# Patient Record
Sex: Male | Born: 1980
Health system: Southern US, Community
[De-identification: ages and names within clinical notes are randomized; demographics above are authoritative.]

## PROBLEM LIST (undated history)

## (undated) DIAGNOSIS — F909 Attention-deficit hyperactivity disorder, unspecified type: Secondary | ICD-10-CM

## (undated) DIAGNOSIS — F419 Anxiety disorder, unspecified: Secondary | ICD-10-CM

## (undated) DIAGNOSIS — K219 Gastro-esophageal reflux disease without esophagitis: Secondary | ICD-10-CM

## (undated) DIAGNOSIS — K649 Unspecified hemorrhoids: Secondary | ICD-10-CM

## (undated) DIAGNOSIS — Z720 Tobacco use: Secondary | ICD-10-CM

## (undated) HISTORY — DX: Gastro-esophageal reflux disease without esophagitis: K21.9

## (undated) HISTORY — DX: Attention-deficit hyperactivity disorder, unspecified type: F90.9

## (undated) HISTORY — DX: Tobacco use: Z72.0

---

## 1999-10-29 HISTORY — PX: WISDOM TOOTH EXTRACTION: SHX21

## 2006-12-12 ENCOUNTER — Other Ambulatory Visit: Payer: Self-pay

## 2006-12-12 ENCOUNTER — Emergency Department: Payer: Self-pay | Admitting: Unknown Physician Specialty

## 2007-10-09 ENCOUNTER — Emergency Department: Payer: Self-pay | Admitting: Emergency Medicine

## 2007-10-10 ENCOUNTER — Other Ambulatory Visit: Payer: Self-pay

## 2012-10-28 HISTORY — PX: COLONOSCOPY: SHX174

## 2014-03-30 ENCOUNTER — Emergency Department: Payer: Self-pay | Admitting: Emergency Medicine

## 2014-03-30 LAB — CBC
HCT: 41.7 %
HGB: 13.4 g/dL
MCH: 28.8 pg
MCHC: 32.2 g/dL
MCV: 90 fL
Platelet: 206 x10 3/mm 3
RBC: 4.66 x10 6/mm 3
RDW: 13.7 %
WBC: 4.7 x10 3/mm 3

## 2014-03-30 LAB — COMPREHENSIVE METABOLIC PANEL
ALBUMIN: 3.7 g/dL (ref 3.4–5.0)
ANION GAP: 6 — AB (ref 7–16)
Alkaline Phosphatase: 70 U/L
BUN: 21 mg/dL — ABNORMAL HIGH (ref 7–18)
Bilirubin,Total: 0.3 mg/dL (ref 0.2–1.0)
CHLORIDE: 107 mmol/L (ref 98–107)
CREATININE: 0.81 mg/dL (ref 0.60–1.30)
Calcium, Total: 8.4 mg/dL — ABNORMAL LOW (ref 8.5–10.1)
Co2: 27 mmol/L (ref 21–32)
EGFR (African American): >60
Glucose: 87 mg/dL (ref 65–99)
OSMOLALITY: 282 (ref 275–301)
Potassium: 4.7 mmol/L (ref 3.5–5.1)
SGOT(AST): 20 U/L (ref 15–37)
SGPT (ALT): 24 U/L (ref 12–78)
SODIUM: 140 mmol/L (ref 136–145)
Total Protein: 6.8 g/dL (ref 6.4–8.2)

## 2014-03-30 LAB — TROPONIN I: Troponin-I: <0.02 ng/mL

## 2014-12-19 IMAGING — CR DG CHEST 2V
1 series · 2 of 2 positions shown · non-contrast
Comparison: Two-view chest 12/12/2006.

CLINICAL DATA: Chest pain for 3-4 days.

EXAM:
CHEST  2 VIEW

[Series 1: w chest pa · 0.14mm/px · 2 of 2 slices shown]
[im 1/2]
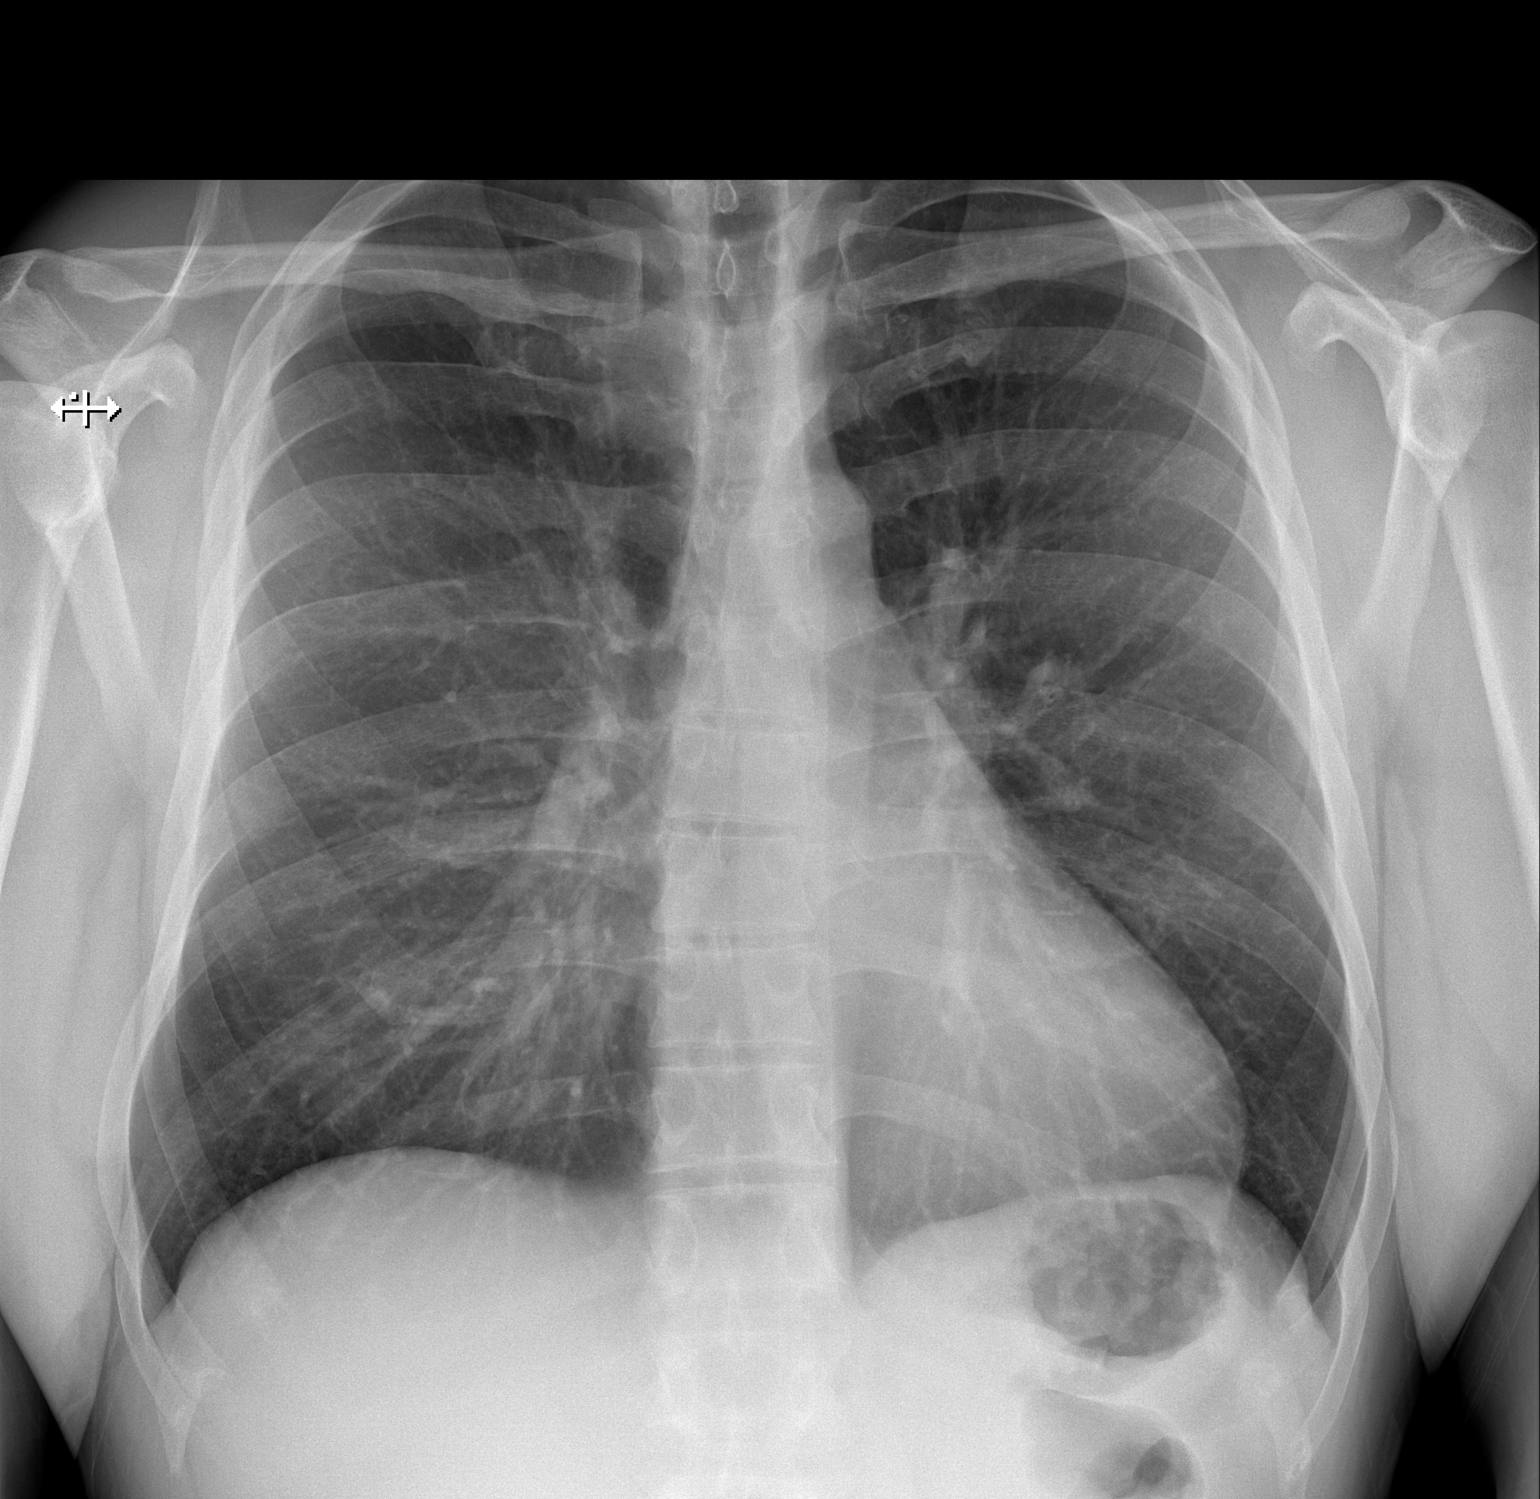
[im 2/2]
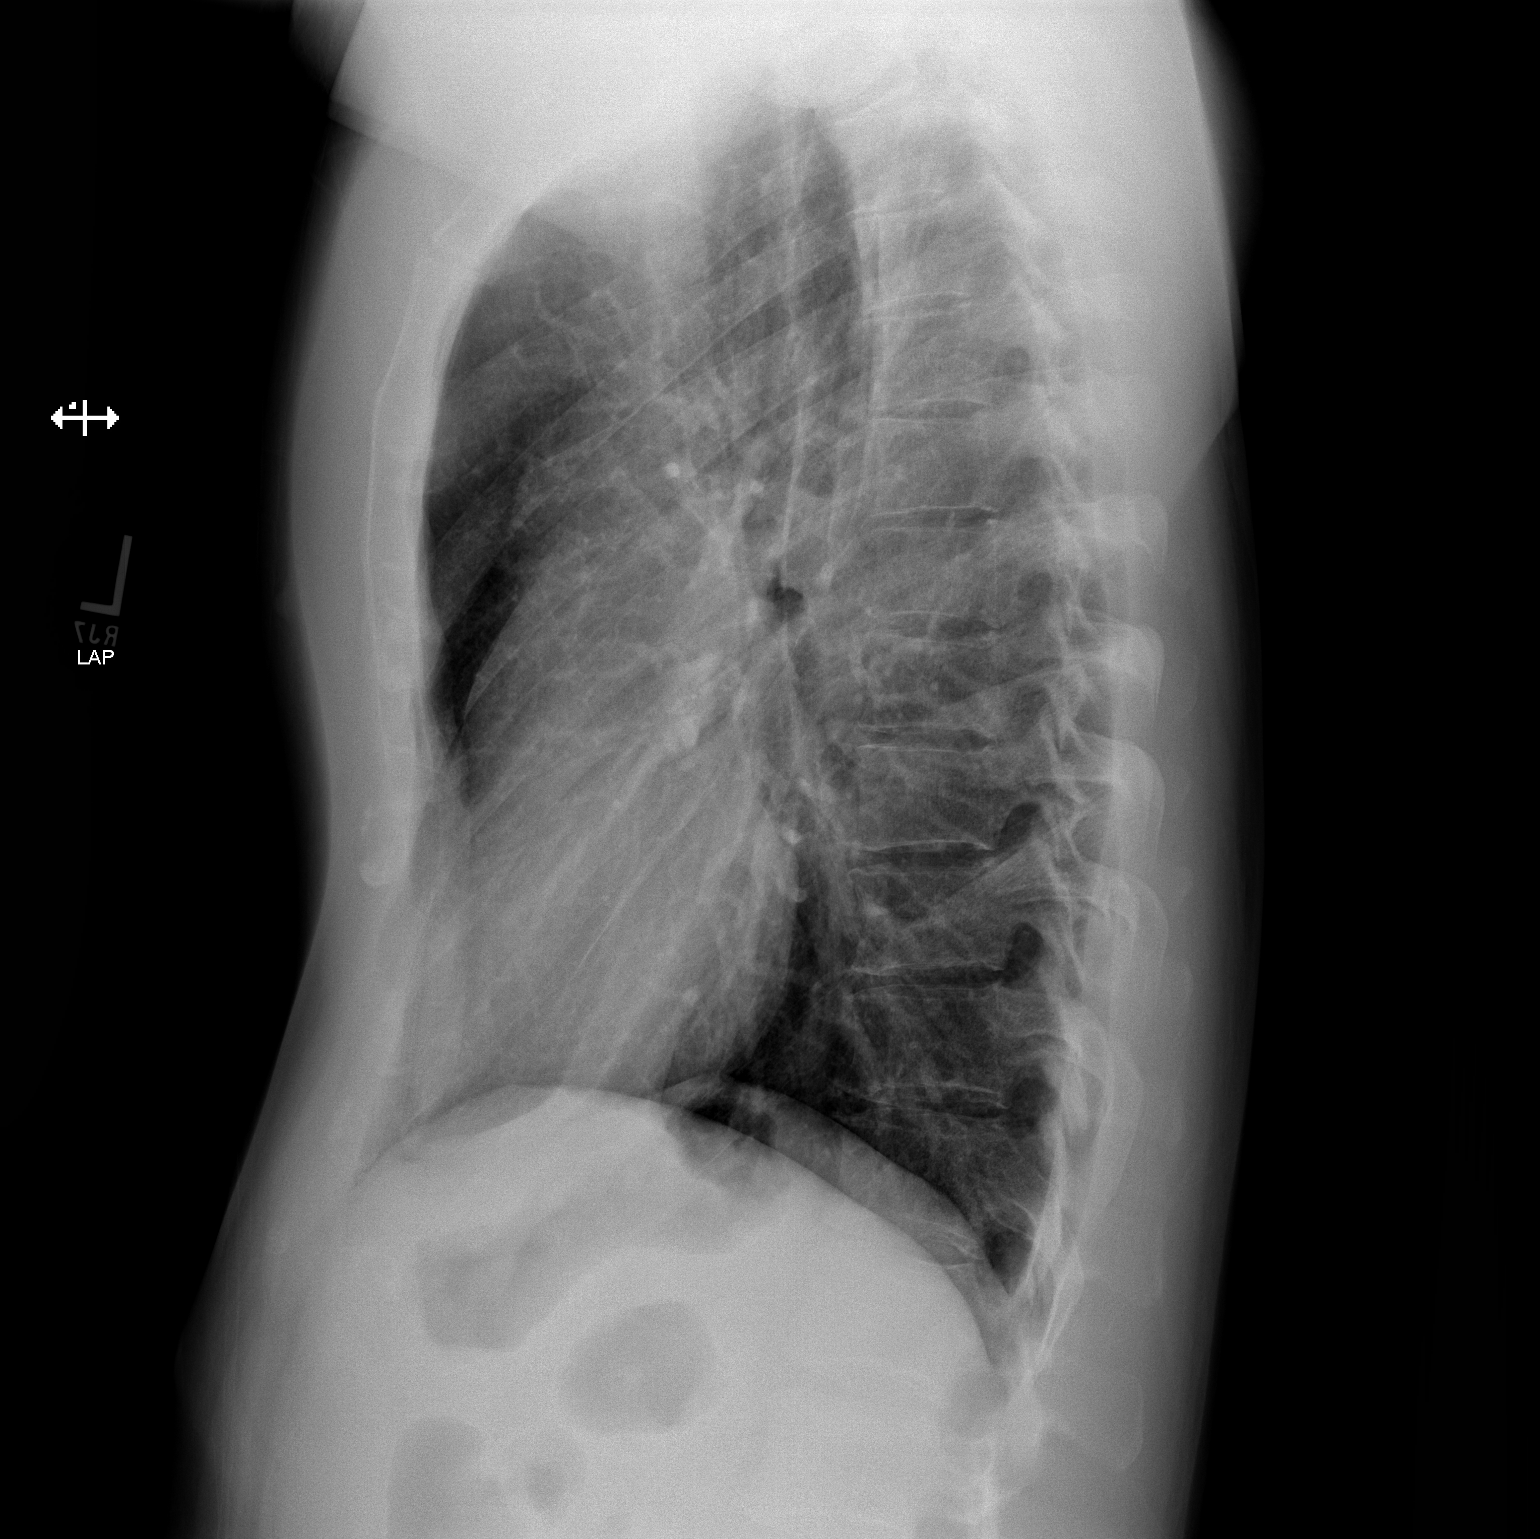

[2 of 2 positions shown; findings below may reference images not displayed]

FINDINGS: The heart size is normal. A pectus excavatum deformity is evident.
The lungs are clear. The visualized soft tissues and bony thorax are
unremarkable.
IMPRESSION: Negative two view chest.

## 2017-04-16 ENCOUNTER — Encounter: Payer: Self-pay | Admitting: Family Medicine

## 2017-04-16 ENCOUNTER — Ambulatory Visit (INDEPENDENT_AMBULATORY_CARE_PROVIDER_SITE_OTHER): Payer: 59 | Admitting: Family Medicine

## 2017-04-16 VITALS — BP 124/80 | Ht 69.0 in | Wt 212.1 lb

## 2017-04-16 DIAGNOSIS — F411 Generalized anxiety disorder: Secondary | ICD-10-CM | POA: Insufficient documentation

## 2017-04-16 DIAGNOSIS — F909 Attention-deficit hyperactivity disorder, unspecified type: Secondary | ICD-10-CM

## 2017-04-16 MED ORDER — AMPHETAMINE-DEXTROAMPHET ER 30 MG PO CP24: 30.0000 mg | ORAL_CAPSULE | Freq: Every day | ORAL | 0 refills | Status: DC

## 2017-04-16 MED ORDER — AMPHETAMINE-DEXTROAMPHET ER 20 MG PO CP24: 20.0000 mg | ORAL_CAPSULE | ORAL | 0 refills | Status: DC

## 2017-04-16 NOTE — Patient Instructions (Addendum)
Web MD look up ADHD

## 2017-04-16 NOTE — Progress Notes (Signed)
   Subjective:    Patient ID: Ronald Mccarty, male    DOB: 1981-03-27, 36 y.o.   MRN: [id number]  HPI  Patient in today with c/o difficulty focusing. Patient states used to take ritalin in grade school.   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   Review of Systems No headache, no major weight loss or weight gain, no chest pain no back pain abdominal pain no change in bowel habits complete ROS otherwise negative     Objective:   Physical Exam  Alert and oriented, vitals reviewed and stable, NAD ENT-TM's and ext canals WNL bilat via otoscopic exam Soft palate, tonsils and post pharynx WNL via oropharyngeal exam Neck-symmetric, no masses; thyroid nonpalpable and nontender Pulmonary-no tachypnea or accessory muscle use; Clear without wheezes via auscultation Card--no abnrml murmurs, rhythm reg and rate WNL Carotid pulses symmetric, without bruits       Assessment & Plan:  adult ADHD long discussion held. Adult ADHD. DSM criteria positive for 9 out of 9 questions and inattention category. Long discussion held regarding medications. Patient deftly would like to try. Adderall XR initiated. 20 mg every morning. Then increase to 30 mg after one month. Follow-up in 4 months. #2 generalized anxiety disorder. Patient took citalopram. Did not like the side  effects and did not help that much. Wishes no further medication in this regard at this time exercise encouraged

## 2017-05-13 ENCOUNTER — Telehealth: Payer: Self-pay | Admitting: Family Medicine

## 2017-05-13 NOTE — Telephone Encounter (Signed)
Patient's Adderral 30 ER was approved from 05/13/17-05/13/18 via [employer].

## 2017-06-11 ENCOUNTER — Telehealth: Payer: Self-pay | Admitting: Family Medicine

## 2017-06-11 MED ORDER — AMPHETAMINE-DEXTROAMPHET ER 30 MG PO CP24: 30.0000 mg | ORAL_CAPSULE | Freq: Every day | ORAL | 0 refills | Status: DC

## 2017-06-11 NOTE — Telephone Encounter (Signed)
If not given enough refill rx til next visit, may go ahead and write two, dr scott shold see around [facility]

## 2017-06-11 NOTE — Telephone Encounter (Signed)
Prescriptions up front for pick up. Patient notified 

## 2017-06-11 NOTE — Telephone Encounter (Signed)
Pt is requesting refills on  amphetamine-dextroamphetamine (ADDERALL XR) 30 MG 24 hr capsule

## 2017-07-11 ENCOUNTER — Telehealth: Payer: Self-pay | Admitting: Family Medicine

## 2017-07-11 ENCOUNTER — Other Ambulatory Visit: Payer: Self-pay | Admitting: *Deleted

## 2017-07-11 MED ORDER — FLUCONAZOLE 150 MG PO TABS: 150.0000 mg | ORAL_TABLET | Freq: Every day | ORAL | 0 refills | Status: DC

## 2017-07-11 NOTE — Telephone Encounter (Signed)
Med sent to pharm. Pt notified.  

## 2017-07-11 NOTE — Telephone Encounter (Signed)
Patients wife believes Norma has a yeast infection.  She, herself, has one.  She is requesting Rx called in.  [facility] [place] (not the one located in [employer]).

## 2017-07-11 NOTE — Telephone Encounter (Signed)
Skin is red and irritated. No discharge

## 2017-07-11 NOTE — Telephone Encounter (Signed)
Diflucan 150 one time dose, topical otc lamisil cr bid to affetecd area

## 2017-08-11 ENCOUNTER — Ambulatory Visit (INDEPENDENT_AMBULATORY_CARE_PROVIDER_SITE_OTHER): Payer: 59 | Admitting: Family Medicine

## 2017-08-11 ENCOUNTER — Encounter: Payer: Self-pay | Admitting: Family Medicine

## 2017-08-11 VITALS — BP 120/80 | Ht 69.0 in | Wt 203.0 lb

## 2017-08-11 DIAGNOSIS — Z23 Encounter for immunization: Secondary | ICD-10-CM | POA: Diagnosis not present

## 2017-08-11 DIAGNOSIS — F909 Attention-deficit hyperactivity disorder, unspecified type: Secondary | ICD-10-CM

## 2017-08-11 MED ORDER — AMPHETAMINE-DEXTROAMPHET ER 30 MG PO CP24: 30.0000 mg | ORAL_CAPSULE | Freq: Every day | ORAL | 0 refills | Status: DC

## 2017-08-11 MED ORDER — AMPHETAMINE-DEXTROAMPHET ER 10 MG PO CP24
10.0000 mg | ORAL_CAPSULE | Freq: Every day | ORAL | 0 refills | Status: DC
Start: 2017-08-11 — End: 2017-08-11

## 2017-08-11 MED ORDER — AMPHETAMINE-DEXTROAMPHET ER 10 MG PO CP24: 10.0000 mg | ORAL_CAPSULE | Freq: Every day | ORAL | 0 refills | Status: DC

## 2017-08-11 NOTE — Progress Notes (Signed)
   Subjective:    Patient ID: Ronald Mccarty, male    DOB: 1981/10/12, 36 y.o.   MRN: [id number]  HPI  Patient was seen today for ADD checkup. -weight, vital signs reviewed.  The following items were covered. -Compliance with medication : Takes daily   -Problems with completing homework, paying attention/taking good notes in school:  Patient states some difficulty concentrating after 3 pm. Currently in school to get masters states has difficulty after the medication wears off.   -grades:  Patient states grades are still doing good.   - Eating patterns :  Patient states eating habits are normal.   -sleeping:  Patient states sleeps well.   -Additional issues or questions:  Patient states no other concerns this visit.   Overall things going well, focusing better  Over the past mo notes its wearing off    Takes early around 4 30 sometimes 5 30  Notes work start around five a m  Work day goes to 3 30 or 4 30, then taking two classe s  Online  Online class all week, lot of group projects    Review of Systems No headache, no major weight loss or weight gain, no chest pain no back pain abdominal pain no change in bowel habits complete ROS otherwise negative     Objective:   Physical Exam Alert vitals stable, NAD. Blood pressure good on repeat. HEENT normal. Lungs clear. Heart regular rate and rhythm.        Assessment & Plan:  Impression ADHD. Suboptimum control particularly later in the afternoon and early evening. Discussed. Plan will maintain 30 Morning.will add short acting Adderall in the mid afternoon. Rationale discussed.

## 2017-08-18 ENCOUNTER — Other Ambulatory Visit: Payer: Self-pay | Admitting: *Deleted

## 2017-08-18 MED ORDER — AMPHETAMINE-DEXTROAMPHETAMINE 10 MG PO TABS: ORAL_TABLET | ORAL | 0 refills | Status: DC

## 2017-08-18 MED ORDER — AMPHETAMINE-DEXTROAMPHETAMINE 10 MG PO TABS: 10.0000 mg | ORAL_TABLET | Freq: Every day | ORAL | 0 refills | Status: DC

## 2017-08-22 ENCOUNTER — Telehealth: Payer: Self-pay | Admitting: Family Medicine

## 2017-08-22 NOTE — Telephone Encounter (Signed)
Requesting status to prior authorization for his Adderall.

## 2017-08-25 NOTE — Telephone Encounter (Signed)
Patient called to check on this

## 2017-08-25 NOTE — Telephone Encounter (Signed)
Submitted PA through cover my meds today. Pt notified we will check on status tomorrow

## 2017-08-26 NOTE — Telephone Encounter (Signed)
Fax from [employer] for notice of approval case # [id number]. Approved through 08/25/18. Called pharm and med did go through with a co pay of $16.25. Pt notified.

## 2017-09-22 ENCOUNTER — Encounter: Payer: Self-pay | Admitting: Family Medicine

## 2017-09-22 ENCOUNTER — Ambulatory Visit (INDEPENDENT_AMBULATORY_CARE_PROVIDER_SITE_OTHER): Payer: 59 | Admitting: Family Medicine

## 2017-09-22 VITALS — BP 122/74 | Temp 98.4°F | Ht 69.0 in | Wt 205.0 lb

## 2017-09-22 DIAGNOSIS — K2900 Acute gastritis without bleeding: Secondary | ICD-10-CM | POA: Diagnosis not present

## 2017-09-22 MED ORDER — BUPROPION HCL ER (SR) 150 MG PO TB12: ORAL_TABLET | ORAL | 2 refills | Status: DC

## 2017-09-22 MED ORDER — PANTOPRAZOLE SODIUM 40 MG PO TBEC: 40.0000 mg | DELAYED_RELEASE_TABLET | Freq: Every day | ORAL | 2 refills | Status: DC

## 2017-09-22 NOTE — Progress Notes (Signed)
   Subjective:    Patient ID: Ronald Mccarty, male    DOB: 07/10/1981, 36 y.o.   MRN: [id number]  HPI  Patient is here today for stomach cramping, Upper to mid abdomin soreness,tender under ribcage for a week now. No Nausea or vomiting. He has tried beno,tums,and miralax,which none have helped.  Started hurting in the mid abdomen seems to move around some   Three uartes ppd  occasiona l alcohol intake   Tylenol reg, takes hx of colon which showed changes so avoid nsaid's  Gradually came on in mid abd noted tendernes, notes incr g I sounds while at   No nausea  apetite o k  Not sleeping the best      Pos hx of reflux and heartburn    Review of Systems No headache, no major weight loss or weight gain, no chest pain no back pain abdominal pain no change in bowel habits complete ROS otherwise negative     Objective:   Physical Exam Alert vitals stable, NAD. Blood pressure good on repeat. HEENT normal. Lungs clear. Heart regular rate and rhythm. Distinct tenderness upper mid epigastrium   impression gastri       Assessment & Plan:  Discussed with patient.  Encouraged to stop smoking.  Wellbutrin use discussed and encouraged.  Protonix  daily for a month. 10.  Wellbutrin initiated.

## 2017-10-22 DIAGNOSIS — M5431 Sciatica, right side: Secondary | ICD-10-CM | POA: Diagnosis not present

## 2017-10-22 DIAGNOSIS — M9905 Segmental and somatic dysfunction of pelvic region: Secondary | ICD-10-CM | POA: Diagnosis not present

## 2017-10-22 DIAGNOSIS — M9903 Segmental and somatic dysfunction of lumbar region: Secondary | ICD-10-CM | POA: Diagnosis not present

## 2017-12-10 ENCOUNTER — Ambulatory Visit (INDEPENDENT_AMBULATORY_CARE_PROVIDER_SITE_OTHER): Payer: 59 | Admitting: Family Medicine

## 2017-12-10 ENCOUNTER — Encounter: Payer: Self-pay | Admitting: Family Medicine

## 2017-12-10 VITALS — BP 124/78 | Ht 69.0 in | Wt 214.0 lb

## 2017-12-10 DIAGNOSIS — F909 Attention-deficit hyperactivity disorder, unspecified type: Secondary | ICD-10-CM | POA: Diagnosis not present

## 2017-12-10 DIAGNOSIS — K648 Other hemorrhoids: Secondary | ICD-10-CM | POA: Diagnosis not present

## 2017-12-10 DIAGNOSIS — F411 Generalized anxiety disorder: Secondary | ICD-10-CM

## 2017-12-10 MED ORDER — AMPHETAMINE-DEXTROAMPHET ER 30 MG PO CP24: 30.0000 mg | ORAL_CAPSULE | Freq: Every day | ORAL | 0 refills | Status: DC

## 2017-12-10 MED ORDER — BUPROPION HCL ER (SR) 150 MG PO TB12: ORAL_TABLET | ORAL | 5 refills | Status: DC

## 2017-12-10 MED ORDER — AMPHETAMINE-DEXTROAMPHETAMINE 10 MG PO TABS: ORAL_TABLET | ORAL | 0 refills | Status: DC

## 2017-12-10 MED ORDER — HYDROCORTISONE 2.5 % RE CREA: 1.0000 "application " | TOPICAL_CREAM | Freq: Two times a day (BID) | RECTAL | 3 refills | Status: DC
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
## 2017-12-23 ENCOUNTER — Telehealth: Payer: Self-pay | Admitting: Family Medicine

## 2017-12-23 DIAGNOSIS — K649 Unspecified hemorrhoids: Secondary | ICD-10-CM

## 2017-12-23 NOTE — Telephone Encounter (Signed)
Orders placed in the system. Pt is aware.

## 2017-12-23 NOTE — Telephone Encounter (Signed)
Patient seen Dr. Richardson Landry on 12/10/17 for various issues, but was told to call back if his hemorrhoid issue got worse.  He said it is worse and would like referral to specialist.

## 2017-12-23 NOTE — Telephone Encounter (Signed)
Refer to GI who bands hemmorrhoid I think are local gis do this

## 2017-12-24 ENCOUNTER — Ambulatory Visit (INDEPENDENT_AMBULATORY_CARE_PROVIDER_SITE_OTHER): Payer: 59 | Admitting: Nurse Practitioner

## 2017-12-24 ENCOUNTER — Telehealth: Payer: Self-pay | Admitting: Family Medicine

## 2017-12-24 ENCOUNTER — Other Ambulatory Visit: Payer: Self-pay | Admitting: *Deleted

## 2017-12-24 ENCOUNTER — Encounter: Payer: Self-pay | Admitting: *Deleted

## 2017-12-24 ENCOUNTER — Encounter: Payer: Self-pay | Admitting: Nurse Practitioner

## 2017-12-24 ENCOUNTER — Other Ambulatory Visit (HOSPITAL_COMMUNITY)
Admission: RE | Admit: 2017-12-24 | Discharge: 2017-12-24 | Disposition: A | Discharge status: Home / Self Care | Payer: 59 | Source: Ambulatory Visit | Attending: Nurse Practitioner | Admitting: Nurse Practitioner

## 2017-12-24 DIAGNOSIS — K625 Hemorrhage of anus and rectum: Secondary | ICD-10-CM | POA: Insufficient documentation

## 2017-12-24 DIAGNOSIS — K649 Unspecified hemorrhoids: Secondary | ICD-10-CM | POA: Diagnosis not present

## 2017-12-24 DIAGNOSIS — K5900 Constipation, unspecified: Secondary | ICD-10-CM | POA: Insufficient documentation

## 2017-12-24 LAB — CBC WITH DIFFERENTIAL/PLATELET
BASOS PCT: 0 %
Basophils Absolute: 0 10*3/uL (ref 0.0–0.1)
EOS ABS: 0.1 10*3/uL (ref 0.0–0.7)
EOS PCT: 1 %
HCT: 22.3 % — ABNORMAL LOW (ref 39.0–52.0)
Hemoglobin: 7.3 g/dL — ABNORMAL LOW (ref 13.0–17.0)
Lymphocytes Relative: 49 %
Lymphs Abs: 2.6 10*3/uL (ref 0.7–4.0)
MCH: 28.9 pg (ref 26.0–34.0)
MCHC: 32.7 g/dL (ref 30.0–36.0)
MCV: 88.1 fL (ref 78.0–100.0)
Monocytes Absolute: 0.5 10*3/uL (ref 0.1–1.0)
Monocytes Relative: 9 %
NEUTROS PCT: 41 %
Neutro Abs: 2.3 10*3/uL (ref 1.7–7.7)
PLATELETS: 336 10*3/uL (ref 150–400)
RBC: 2.53 MIL/uL — ABNORMAL LOW (ref 4.22–5.81)
RDW: 13.2 % (ref 11.5–15.5)
WBC: 5.5 10*3/uL (ref 4.0–10.5)

## 2017-12-24 MED ORDER — NA SULFATE-K SULFATE-MG SULF 17.5-3.13-1.6 GM/177ML PO SOLN: 1.0000 | ORAL | 0 refills | Status: DC

## 2017-12-24 NOTE — Assessment & Plan Note (Addendum)
Chronic history of hemorrhoids with rectal bleeding intermittently.  His rectal bleeding has become heavier recently and he admits some fatigue and lightheadedness.  I will check a CBC today.  We will also proceed with colonoscopy.  He did have a colonoscopy somewhere in [place] near [facility] about 5 years ago.  During the colonoscopy we can also establish potential candidacy for hemorrhoid banding, as he is interested in hemorrhoid banding.  He states his brother just recently had this and did well with it.  His rectal bleeding could be from large internal bleeding hemorrhoids, other benign anorectal source, less likely colorectal cancer or inflammatory bowel disease.  Follow-up in 3 months.  Proceed with TCS on propofol/MAC with Dr. Gala Romney in near future: the risks, benefits, and alternatives have been discussed with the patient in detail. The patient states understanding and desires to proceed.  The patient is currently on Adderall.  He is younger age.  He was previously on Wellbutrin to attempt to quit smoking but is recently stopped this.  Given his medications and age we will plan for the procedure on propofol/MAC to promote adequate sedation.

## 2017-12-24 NOTE — Telephone Encounter (Signed)
I spoke with Ronald Mccarty at [facility] gastro and they will work pt in today at 2 pm and made note to check blood count. Pt is aware of all and aware that if they dont draw blood to call me back so we can arrange for that to be done.

## 2017-12-24 NOTE — Assessment & Plan Note (Signed)
Patient has constipation.  Intermittently he has hard stools with straining.  He admits he sits on the toilet too long.  Some occasional abdominal pain which typically resolves after a bowel movement.  He also has bloating which resolves after a bowel movement.  At this point, given his large bleeding hemorrhoids, I will start him on Linzess 72 mcg daily to help have better bowel movements.  I will provide samples for a couple weeks and ask for a progress report in 1-2 weeks.  Our goal is to have [place] 4-5 stools while avoiding constipation and frequent diarrhea.  We can titrate his medications appropriately to achieve this goal while we await colonoscopy.  Return for follow-up in 3 months.

## 2017-12-24 NOTE — Progress Notes (Signed)
cc'ed to pcp °

## 2017-12-24 NOTE — Addendum Note (Signed)
Addended by: Gordy Levan, ERIC A on: 12/24/2017 03:22 PM   Modules accepted: Orders

## 2017-12-24 NOTE — Progress Notes (Signed)
Primary Care Physician:  Mikey Kirschner, MD Primary Gastroenterologist:  Dr. Gala Romney  Chief Complaint  Patient presents with  . Hemorrhoids    bleeding  . light headed    ?anxious or from bleeding    HPI:   Ronald Mccarty is a 37 y.o. male who presents on referral from primary care for hemorrhoids.  The most recent primary care note was reviewed which is dated 12/10/2017.  Recent hemorrhoid flares, associated with some straining and constipation.  Metamucil and as needed stool softener has been tried.  Also noted blood in his stool which was frequent and recent.  Recommended MiraLAX and Colace, add Anusol cream if persistent.    Patient called his primary care office saying his hemorrhoids have gotten worse indicating he was having heavier bleeding and some lightheadedness and not sure if this is from anxiety or from the blood loss.  He has been researching hemorrhoids and blood loss online.  Per primary care phone note the patient was referred for hemorrhoid banding (although banding is not appropriate prior to colonoscopy.)  Primary care provider recommended patient proceed to the emergency room if he is feeling lightheaded and noted to have labs evaluated.  However the patient preferred an outpatient office visit and he was worked in urgently per PCP request.  No history of colonoscopy or endoscopy in our system.  Today he states he's doing ok overall but very worried. Has history of hemorrhoids and has had seen blood before, but seeing more recently. Having some lightheadedness and "can feel my heartbeat in my ears." Thinks anxiety had been at play. Current bleeding ongoing for about 1-2 weeks. Has blood with every bowel movement. Sometimes has a sensation of need to use the bathroom but will have bleeding/heavy dripping instead of feces. Has a bowel movement about daily, typically in the morning. Thinks he probably sits too long and has chronic intermittent constipation. Occasionally  hard stools with straining and sometimes not. Has tried MiraLAX, Metamucil, stool softener. Had a colonoscopy in [place] about 5 years ago due to heme+ stool. Intermittent/rare abdominal pain. Denies N/V. Pain often improves after a bowel movement. Has sensation of incomplete emptying. Has hemorrhoid symptoms including rectal pain, irritation, itching. Denies fever, chills, unintentional weight loss. Has some recent lightheadedness and significant fatigue. Denies chest pain, dyspnea, syncope, near syncope. Denies any other upper or lower GI symptoms.  Past Medical History:  Diagnosis Date  . ADHD   . GERD (gastroesophageal reflux disease)   . Tobacco use     Past Surgical History:  Procedure Laterality Date  . COLONOSCOPY  2014   [place]  . WISDOM TOOTH EXTRACTION  2001    Current Outpatient Medications  Medication Sig Dispense Refill  . amphetamine-dextroamphetamine (ADDERALL XR) 30 MG 24 hr capsule Take 1 capsule (30 mg total) by mouth daily. 30 capsule 0  . hydrocortisone (ANUSOL-HC) 2.5 % rectal cream Place 1 application rectally 2 (two) times daily. 30 g 3  . pantoprazole (PROTONIX) 40 MG tablet Take 1 tablet (40 mg total) by mouth daily. 30 tablet 2   No current facility-administered medications for this visit.     Allergies as of 12/24/2017  . (No Known Allergies)    Family History  Problem Relation Age of Onset  . Colon cancer Neg Hx     Social History   Socioeconomic History  . Marital status: Single    Spouse name: Not on file  . Number of children: Not on file  .  Years of education: Not on file  . Highest education level: Not on file  Social Needs  . Financial resource strain: Not on file  . Food insecurity - worry: Not on file  . Food insecurity - inability: Not on file  . Transportation needs - medical: Not on file  . Transportation needs - non-medical: Not on file  Occupational History  . Not on file  Tobacco Use  . Smoking status: Current Every  Day Smoker    Packs/day: 1.00    Years: 20.00    Pack years: 20.00    Types: Cigarettes    Start date: 10/28/1996  . Smokeless tobacco: Never Used  Substance and Sexual Activity  . Alcohol use: Yes    Frequency: Never    Comment: occ  . Drug use: No  . Sexual activity: Not on file  Other Topics Concern  . Not on file  Social History Narrative  . Not on file    Review of Systems: General: Negative for anorexia, weight loss, fever, chills, fatigue, weakness. ENT: Negative for hoarseness, difficulty swallowing , nasal congestion. CV: Negative for chest pain, angina, palpitations, dyspnea on exertion, peripheral edema.  Respiratory: Negative for dyspnea at rest, dyspnea on exertion, cough, sputum, wheezing.  GI: See history of present illness. MS: Negative for joint pain, low back pain.  Derm: Negative for rash or itching.  Neuro: Admits ADHD.  Endo: Negative for unusual weight change.  Heme: Negative for bruising. Allergy: Negative for rash or hives.    Physical Exam: BP 133/74   Pulse (!) 104   Temp (!) 97.5 F (36.4 C) (Oral)   Ht 5\' 9"  (1.753 m)   Wt 204 lb 6.4 oz (92.7 kg)   BMI 30.18 kg/m  General:   Alert and oriented. Pleasant and cooperative. Well-nourished and well-developed.  Head:  Normocephalic and atraumatic. Eyes:  Without icterus, sclera clear and conjunctiva pink.  Ears:  Normal auditory acuity. Cardiovascular:  S1, S2 present without murmurs appreciated. Normal pulses noted. Extremities without clubbing or edema. Respiratory:  Clear to auscultation bilaterally. No wheezes, rales, or rhonchi. No distress.  Gastrointestinal:  +BS, soft, non-tender and non-distended. No HSM noted. No guarding or rebound. No masses appreciated.  Rectal:  No external hemorrhoids noted. Some slight difficulty with passing what felt to be 2-3 quite large internal hemorrhoids with patient verbalizing pain with this maneuver.  Musculoskalatal:  Symmetrical without gross  deformities. Skin:  Intact without significant lesions or rashes. Neurologic:  Alert and oriented x4;  grossly normal neurologically. Psych:  Alert and cooperative. Normal mood and affect. Heme/Lymph/Immune: No excessive bruising noted.    12/24/2017 2:49 PM   Disclaimer: This note was dictated with voice recognition software. Similar sounding words can inadvertently be transcribed and may not be corrected upon review.

## 2017-12-24 NOTE — Assessment & Plan Note (Signed)
The patient is a chronic history of hemorrhoids in the setting of constipation.  His constipation is ongoing and not well controlled at this point.  Rectal exam revealed 2-3 large hemorrhoids which were internal.  No external hemorrhoids.  We will proceed with colonoscopy as per below.  Continue Anusol rectal cream for any protruding or bleeding hemorrhoids.  Follow-up in 3 months.  He may be a candidate for hemorrhoid banding and he is interested in this.  He states his brother just recently underwent hemorrhoid banding and did well with it.

## 2017-12-24 NOTE — Telephone Encounter (Signed)
Good thanks

## 2017-12-24 NOTE — Telephone Encounter (Signed)
It would be up to the patient.  If he is feeling lightheaded he ought to be seen and check a hemoglobin

## 2017-12-24 NOTE — Telephone Encounter (Signed)
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 Check Hgb to see if blood loss causing lightheadedness? I told the pt I would check with you if he should get worse in the mean time and feel like he is needing immediate attention to go to the Ed.I called Dr.Feilds office and spoke to [facility] and she states they have the referral,but they have no appt available any time in the next few days,but when they do she will call the pt with the appt. Dr.Steve off today,messasge sent to Dr.Scott and Dr.Steve to keep updated.

## 2017-12-24 NOTE — Telephone Encounter (Signed)
Pt is requesting a call from someone regarding the problem he is having with his hemorrhoids. Pt states that he has been looking up things online regarding this. Pt states that the bleeding this time is heavier and states that he is experiencing light headiness and states that he doesn't know if it is from anxiety due to this or if it is from the loss of blood. Please advise.

## 2017-12-24 NOTE — Patient Instructions (Signed)
1. Have your blood test drawn today. 2. We will request your previous colonoscopy report. 3. We will schedule your colonoscopy for you. 4. I am giving you samples of Linzess 72 mcg.  Take this once a day, on an empty stomach. 5. Call us in 1-2 weeks and let us know if it is helping. 6. Call us and let us know if you are having significant diarrhea that lasts more than 5 days or diarrhea that is intolerable. 7. Return for follow-up in 3 months. 8. As we discussed, if you have any severe dizziness, lightheadedness, passing out, nearly passing out, chest pain, significant shortness of breath and proceed to the emergency department. 9. Call us if you have any questions or concerns.

## 2017-12-25 ENCOUNTER — Other Ambulatory Visit: Payer: Self-pay

## 2017-12-25 ENCOUNTER — Encounter: Payer: Self-pay | Admitting: *Deleted

## 2017-12-25 ENCOUNTER — Inpatient Hospital Stay (HOSPITAL_COMMUNITY)
Admission: EM | Admit: 2017-12-25 | Discharge: 2017-12-27 | DRG: 394 | Disposition: A | Discharge status: Other Acute Inpatient Hospital | Payer: 59 | Attending: Internal Medicine | Admitting: Internal Medicine

## 2017-12-25 ENCOUNTER — Telehealth: Payer: Self-pay | Admitting: *Deleted

## 2017-12-25 ENCOUNTER — Encounter (HOSPITAL_COMMUNITY): Payer: Self-pay | Admitting: *Deleted

## 2017-12-25 ENCOUNTER — Telehealth: Payer: Self-pay

## 2017-12-25 DIAGNOSIS — F411 Generalized anxiety disorder: Secondary | ICD-10-CM | POA: Diagnosis not present

## 2017-12-25 DIAGNOSIS — K644 Residual hemorrhoidal skin tags: Secondary | ICD-10-CM | POA: Diagnosis not present

## 2017-12-25 DIAGNOSIS — K921 Melena: Secondary | ICD-10-CM | POA: Diagnosis not present

## 2017-12-25 DIAGNOSIS — K649 Unspecified hemorrhoids: Secondary | ICD-10-CM | POA: Diagnosis present

## 2017-12-25 DIAGNOSIS — D62 Acute posthemorrhagic anemia: Secondary | ICD-10-CM | POA: Diagnosis present

## 2017-12-25 DIAGNOSIS — K219 Gastro-esophageal reflux disease without esophagitis: Secondary | ICD-10-CM | POA: Diagnosis not present

## 2017-12-25 DIAGNOSIS — F17210 Nicotine dependence, cigarettes, uncomplicated: Secondary | ICD-10-CM | POA: Diagnosis present

## 2017-12-25 DIAGNOSIS — R5383 Other fatigue: Secondary | ICD-10-CM | POA: Diagnosis not present

## 2017-12-25 DIAGNOSIS — D649 Anemia, unspecified: Secondary | ICD-10-CM | POA: Diagnosis not present

## 2017-12-25 DIAGNOSIS — K5909 Other constipation: Secondary | ICD-10-CM | POA: Diagnosis present

## 2017-12-25 DIAGNOSIS — K648 Other hemorrhoids: Secondary | ICD-10-CM | POA: Diagnosis not present

## 2017-12-25 DIAGNOSIS — K625 Hemorrhage of anus and rectum: Secondary | ICD-10-CM | POA: Diagnosis present

## 2017-12-25 DIAGNOSIS — K643 Fourth degree hemorrhoids: Principal | ICD-10-CM | POA: Diagnosis present

## 2017-12-25 DIAGNOSIS — Z720 Tobacco use: Secondary | ICD-10-CM | POA: Diagnosis present

## 2017-12-25 DIAGNOSIS — E559 Vitamin D deficiency, unspecified: Secondary | ICD-10-CM | POA: Diagnosis present

## 2017-12-25 DIAGNOSIS — K5900 Constipation, unspecified: Secondary | ICD-10-CM | POA: Diagnosis not present

## 2017-12-25 DIAGNOSIS — Z79899 Other long term (current) drug therapy: Secondary | ICD-10-CM | POA: Diagnosis not present

## 2017-12-25 DIAGNOSIS — Z886 Allergy status to analgesic agent status: Secondary | ICD-10-CM | POA: Diagnosis not present

## 2017-12-25 DIAGNOSIS — F909 Attention-deficit hyperactivity disorder, unspecified type: Secondary | ICD-10-CM | POA: Diagnosis present

## 2017-12-25 HISTORY — DX: Unspecified hemorrhoids: K64.9

## 2017-12-25 HISTORY — DX: Anxiety disorder, unspecified: F41.9

## 2017-12-25 LAB — COMPREHENSIVE METABOLIC PANEL
ALBUMIN: 3.6 g/dL (ref 3.5–5.0)
ALK PHOS: 37 U/L — AB (ref 38–126)
ALT: 22 U/L (ref 17–63)
ANION GAP: 10 (ref 5–15)
AST: 28 U/L (ref 15–41)
BUN: 19 mg/dL (ref 6–20)
CHLORIDE: 104 mmol/L (ref 101–111)
CO2: 21 mmol/L — AB (ref 22–32)
CREATININE: 1 mg/dL (ref 0.61–1.24)
Calcium: 8.4 mg/dL — ABNORMAL LOW (ref 8.9–10.3)
GFR calc Af Amer: >60 mL/min (ref >60)
GFR calc non Af Amer: >60 mL/min (ref >60)
GLUCOSE: 103 mg/dL — AB (ref 65–99)
Potassium: 3.5 mmol/L (ref 3.5–5.1)
SODIUM: 135 mmol/L (ref 135–145)
Total Bilirubin: 0.2 mg/dL — ABNORMAL LOW (ref 0.3–1.2)
Total Protein: 6 g/dL — ABNORMAL LOW (ref 6.5–8.1)

## 2017-12-25 LAB — CBC WITH DIFFERENTIAL/PLATELET
Basophils Absolute: 0 10*3/uL (ref 0.0–0.1)
Basophils Relative: 0 %
Eosinophils Absolute: 0 10*3/uL (ref 0.0–0.7)
Eosinophils Relative: 1 %
HCT: 19.3 % — ABNORMAL LOW (ref 39.0–52.0)
HEMOGLOBIN: 6.1 g/dL — AB (ref 13.0–17.0)
LYMPHS PCT: 36 %
Lymphs Abs: 1.8 10*3/uL (ref 0.7–4.0)
MCH: 27.7 pg (ref 26.0–34.0)
MCHC: 31.6 g/dL (ref 30.0–36.0)
MCV: 87.7 fL (ref 78.0–100.0)
Monocytes Absolute: 0.5 10*3/uL (ref 0.1–1.0)
Monocytes Relative: 9 %
NEUTROS PCT: 54 %
Neutro Abs: 2.6 10*3/uL (ref 1.7–7.7)
Platelets: 371 10*3/uL (ref 150–400)
RBC: 2.2 MIL/uL — AB (ref 4.22–5.81)
RDW: 12.4 % (ref 11.5–15.5)
WBC: 4.9 10*3/uL (ref 4.0–10.5)

## 2017-12-25 LAB — I-STAT CHEM 8, ED
BUN: 17 mg/dL (ref 6–20)
CHLORIDE: 104 mmol/L (ref 101–111)
Calcium, Ion: 1.11 mmol/L — ABNORMAL LOW (ref 1.15–1.40)
Creatinine, Ser: 1 mg/dL (ref 0.61–1.24)
Glucose, Bld: 98 mg/dL (ref 65–99)
HCT: 21 % — ABNORMAL LOW (ref 39.0–52.0)
HEMOGLOBIN: 7.1 g/dL — AB (ref 13.0–17.0)
POTASSIUM: 3.5 mmol/L (ref 3.5–5.1)
SODIUM: 138 mmol/L (ref 135–145)
TCO2: 24 mmol/L (ref 22–32)

## 2017-12-25 LAB — ABO/RH: ABO/RH(D): O POS

## 2017-12-25 LAB — PREPARE RBC (CROSSMATCH)

## 2017-12-25 MED ORDER — POLYETHYLENE GLYCOL 3350 17 G PO PACK
17.0000 g | PACK | Freq: Every day | ORAL | Status: DC
  Administered 2017-12-27: 17 g via ORAL
  Filled 2017-12-25: qty 1

## 2017-12-25 MED ORDER — PANTOPRAZOLE SODIUM 40 MG PO TBEC
40.0000 mg | DELAYED_RELEASE_TABLET | Freq: Every day | ORAL | Status: DC
  Administered 2017-12-26 – 2017-12-27 (×2): 40 mg via ORAL
  Filled 2017-12-25 (×2): qty 1

## 2017-12-25 MED ORDER — SODIUM CHLORIDE 0.9 % IV SOLN: 250.0000 mL | INTRAVENOUS | Status: DC | PRN

## 2017-12-25 MED ORDER — ACETAMINOPHEN 650 MG RE SUPP: 650.0000 mg | Freq: Four times a day (QID) | RECTAL | Status: DC | PRN

## 2017-12-25 MED ORDER — SODIUM CHLORIDE 0.9% FLUSH: 3.0000 mL | INTRAVENOUS | Status: DC | PRN

## 2017-12-25 MED ORDER — SODIUM CHLORIDE 0.9 % IV SOLN
10.0000 mL/h | Freq: Once | INTRAVENOUS | Status: AC
  Administered 2017-12-25: 10 mL/h via INTRAVENOUS

## 2017-12-25 MED ORDER — POLYETHYLENE GLYCOL 3350 17 G PO PACK: 17.0000 g | PACK | Freq: Every day | ORAL | Status: DC

## 2017-12-25 MED ORDER — AMPHETAMINE-DEXTROAMPHET ER 30 MG PO CP24
30.0000 mg | ORAL_CAPSULE | Freq: Every day | ORAL | Status: DC
  Filled 2017-12-25: qty 1

## 2017-12-25 MED ORDER — ALPRAZOLAM 0.25 MG PO TABS
0.2500 mg | ORAL_TABLET | Freq: Once | ORAL | Status: AC
  Administered 2017-12-25: 0.25 mg via ORAL
  Filled 2017-12-25: qty 1

## 2017-12-25 MED ORDER — ONDANSETRON HCL 4 MG/2ML IJ SOLN: 4.0000 mg | Freq: Four times a day (QID) | INTRAMUSCULAR | Status: DC | PRN

## 2017-12-25 MED ORDER — IBUPROFEN 600 MG PO TABS
600.0000 mg | ORAL_TABLET | Freq: Three times a day (TID) | ORAL | Status: DC | PRN
  Administered 2017-12-25: 600 mg via ORAL
  Filled 2017-12-25: qty 1

## 2017-12-25 MED ORDER — ACETAMINOPHEN 325 MG PO TABS
650.0000 mg | ORAL_TABLET | Freq: Four times a day (QID) | ORAL | Status: DC | PRN
  Administered 2017-12-25 – 2017-12-27 (×3): 650 mg via ORAL
  Filled 2017-12-25 (×3): qty 2

## 2017-12-25 MED ORDER — ONDANSETRON HCL 4 MG PO TABS: 4.0000 mg | ORAL_TABLET | Freq: Four times a day (QID) | ORAL | Status: DC | PRN

## 2017-12-25 MED ORDER — PEG 3350-KCL-NA BICARB-NACL 420 G PO SOLR
4000.0000 mL | Freq: Once | ORAL | Status: AC
  Administered 2017-12-25: 4000 mL via ORAL
  Filled 2017-12-25: qty 4000

## 2017-12-25 MED ORDER — SODIUM CHLORIDE 0.9% FLUSH
3.0000 mL | Freq: Two times a day (BID) | INTRAVENOUS | Status: DC
  Administered 2017-12-25 – 2017-12-27 (×4): 3 mL via INTRAVENOUS

## 2017-12-25 MED ORDER — ONDANSETRON HCL 4 MG/2ML IJ SOLN: 4.0000 mg | Freq: Three times a day (TID) | INTRAMUSCULAR | Status: DC | PRN

## 2017-12-25 MED ORDER — SODIUM CHLORIDE 0.9 % IV BOLUS (SEPSIS)
1000.0000 mL | Freq: Once | INTRAVENOUS | Status: AC
  Administered 2017-12-25: 1000 mL via INTRAVENOUS

## 2017-12-25 MED ORDER — HYDROCORTISONE 2.5 % RE CREA
1.0000 "application " | TOPICAL_CREAM | Freq: Two times a day (BID) | RECTAL | Status: DC
  Administered 2017-12-25 – 2017-12-26 (×2): 1 via RECTAL
  Filled 2017-12-25: qty 28.35

## 2017-12-25 NOTE — Progress Notes (Signed)
12/25/2017 6:32 PM  RN requesting to DC SCDs as patient getting up frequently for bowel prep.    Murvin Natal MD

## 2017-12-25 NOTE — Progress Notes (Addendum)
Subjective: States he had more rectal bleeding this morning, dripping blood. Former Public relations account executive. Prolonged time on the toilet with constipation and straining. No abdominal pain. Some nausea this morning, no vomiting. Some worsening dizziness/lightheadedness this morning. No other upper or lower GI symptoms.  Objective: Vital signs in last 24 hours: Temp:  [97.5 F (36.4 C)-98.4 F (36.9 C)] 98.4 F (36.9 C) (02/28 1219) Pulse Rate:  [91-104] 94 (02/28 1230) Resp:  [18-29] 29 (02/28 1230) BP: (122-141)/(74-81) 130/75 (02/28 1230) SpO2:  [99 %-100 %] 99 % (02/28 1230) Weight:  [204 lb (92.5 kg)-204 lb 6.4 oz (92.7 kg)] 204 lb (92.5 kg) (02/28 0931)   General:   Alert and oriented, pleasant Head:  Normocephalic and atraumatic. Eyes:  No icterus, sclera clear. Conjuctiva pink.   Heart:  S1, S2 present, no murmurs noted.  Lungs: Clear to auscultation bilaterally, without wheezing, rales, or rhonchi.  Abdomen:  Bowel sounds present, soft, non-tender, non-distended. No HSM or hernias noted. No rebound or guarding. No masses appreciated  Msk:  Symmetrical without gross deformities. Pulses:  Normal bilateral DP pulses noted. Extremities:  Without clubbing or edema. Neurologic:  Alert and  oriented x4;  grossly normal neurologically. Psych:  Alert and cooperative. Normal mood and affect.  Intake/Output from previous day: No intake/output data recorded. Intake/Output this shift: Total I/O In: 1325 [I.V.:10; Blood:315; IV Piggyback:1000] Out: -   Lab Results: Recent Labs    12/24/17 1555 12/25/17 0945 12/25/17 1004  WBC 5.5 4.9  --   HGB 7.3* 6.1* 7.1*  HCT 22.3* 19.3* 21.0*  PLT 336 371  --    BMET Recent Labs    12/25/17 0945 12/25/17 1004  NA 135 138  K 3.5 3.5  CL 104 104  CO2 21*  --   GLUCOSE 103* 98  BUN 19 17  CREATININE 1.00 1.00  CALCIUM 8.4*  --    LFT Recent Labs    12/25/17 0945  PROT 6.0*  ALBUMIN 3.6  AST 28  ALT 22  ALKPHOS 37*  BILITOT  0.2*   PT/INR No results for input(s): LABPROT, INR in the last 72 hours. Hepatitis Panel No results for input(s): HEPBSAG, HCVAB, HEPAIGM, HEPBIGM in the last 72 hours.   Studies/Results: No results found.  Assessment: Pleasant 37 year old male seen in our office yesterday for significant hemorrhoids and rectal bleeding.  The amount of blood he has been seeing had been escalating over the previous weeks.  He noted some lightheadedness yesterday but felt this was likely anxiety.  He described bleeding with every bowel movement and occasional frank blood with urge to have a bowel movement.  Former weight lifter, occasional hard stools or straining and typically sits on the toilet for prolonged period of time.  Previous colonoscopy in [place] done by a [place] facility with reports not available to [place] at this time.  Hemorrhoid symptoms including rectal pain, irritation, itching.  He can feel when his hemorrhoids prolapse.  His rectal exam yesterday revealed no external hemorrhoids but, likely, very large internal hemorrhoids with pain on rectal exam.  CBC was drawn and found to have a hemoglobin of 7.3.  The patient was referred to the emergency department this morning.  When he arrived in the emergency department his labs showed hemoglobin of 6.1.  His indices are normal, iron studies pending.  More likely an acute bleed rather than slow chronic bleed.  Was also noted to be tachycardic this morning.  He received fluid bolus and  ordered for 2 units to be transfused.  He was receiving his first unit when I saw him in the emergency room today.  His heart rate improved and was at 87.  Blood pressure and other vitals stable.  Exam his hemorrhoids were large but nonobstructive.  He has been having bowel movements.  Prep should not be an issue at this point.  At this point, given his young age, frank GI bleed with significant, symptomatic anemia he would benefit from a colonoscopy.   Incidentally, he was scheduled for an outpatient colonoscopy.  Given his age, use of Adderall and Wellbutrin he would likely need propofol.  Differentials include large, bleeding internal hemorrhoids, rectal mass, diverticular bleed, bleeding polyp, colorectal cancer.   Plan: 1. Closely monitor hemoglobin 2. Monitor for recurrent bleeding 3. Transfuse as necessary 4. Clear liquid diet 5. Likely colonoscopy tomorrow.  I will further discuss with Dr. Oneida Alar. 6. Zofran as needed 7. Supportive measures   Thank you for allowing [place] to participate in the care of Ronald T Manson Passey, DNP, AGNP-C Adult & Gerontological Nurse Practitioner [facility]    LOS: 0 days    12/25/2017, 1:09 PM

## 2017-12-25 NOTE — Telephone Encounter (Signed)
Called spoke with pt spouse and is aware pre-op scheduled for 01/22/18 at 12:45pm. Letter mailed.

## 2017-12-25 NOTE — Telephone Encounter (Signed)
Patient was instructed to go to the Ed per Dr.Steve Luking for possible GI bleed and possible blood transfusion.Ed triage nurse was notified and the Ed Dr was notified along with Neil Crouch with [facility].

## 2017-12-25 NOTE — H&P (Signed)
History and Physical  Ronald Mccarty IEP:[id number] DOB: 27-Jul-1981 DOA: 12/25/2017  Referring physician: Roderic Palau MD  PCP: Mikey Kirschner, MD   Chief Complaint: weakness   HPI: Ronald Mccarty is a 37 y.o. male smoker with generalized anxiety disorder, former weight lifter with chronic constipation and a significant problem with hemorrhoids who reported that they have been much worse over the past several weeks.  In addition, he has had difficulty with bloody stools.  The patient had seen his primary care provider for this and had been referred to GI for evaluation.  He did see GI yesterday and there was some labs tested and his hemoglobin was noted to be low at 7.1 at that time.  The patient continued to have bloody stools.  He was started on suppositories.  The patient also reports that he had been using Metamucil and over-the-counter stool softeners that he would take intermittently.  The patient has a high stress job and also has adult ADHD which he chronically takes Adderall for that.  He is also a smoker.  He has recently been started back on Wellbutrin for smoking cessation and anxiety treatment.  He reports that he feels weak and tired and lightheaded.  ED course: The patient was evaluated in the ED and noted to have a hemoglobin of 6.1.  In addition, he was tachycardic and symptomatic with complaints as noted above.  He has been typed and crossed for 2 units of packed red blood cell transfusion.  In addition, GI has been consulted to see him for further evaluation and management recommendations.  Review of Systems: All systems reviewed and apart from history of presenting illness, are negative.  Past Medical History:  Diagnosis Date  . ADHD   . Anxiety   . GERD (gastroesophageal reflux disease)   . Hemorrhoids   . Tobacco use    Past Surgical History:  Procedure Laterality Date  . COLONOSCOPY  2014   [place]  . WISDOM TOOTH EXTRACTION  2001   Social History:   reports that he has been smoking cigarettes.  He started smoking about 21 years ago. He has a 20.00 pack-year smoking history. he has never used smokeless tobacco. He reports that he drinks alcohol. He reports that he does not use drugs.  Allergies  Allergen Reactions  . Aspirin Nausea Only    Patient prefers not to take; causes stomach distress    Family History  Problem Relation Age of Onset  . Colon cancer Neg Hx     Prior to Admission medications   Medication Sig Start Date End Date Taking? Authorizing Provider  acetaminophen (TYLENOL) 500 MG tablet Take 1,000 mg by mouth every 6 (six) hours as needed for moderate pain.   Yes [provider]  amphetamine-dextroamphetamine (ADDERALL XR) 30 MG 24 hr capsule Take 1 capsule (30 mg total) by mouth daily. 12/10/17  Yes Mikey Kirschner, MD  hydrocortisone (ANUSOL-HC) 2.5 % rectal cream Place 1 application rectally 2 (two) times daily. 12/10/17  Yes Mikey Kirschner, MD  pantoprazole (PROTONIX) 40 MG tablet Take 1 tablet (40 mg total) by mouth daily. 09/22/17 09/22/18 Yes Mikey Kirschner, MD   Physical Exam: Vitals:   12/25/17 0931 12/25/17 0932  BP:  (!) 141/81  Pulse:  94  Resp:  19  Temp:  97.8 F (36.6 C)  TempSrc:  Oral  Weight: 92.5 kg (204 lb)   Height: 5\' 9"  (1.753 m)      General exam:  Moderately built and nourished patient, lying comfortably supine on the gurney in no obvious distress.  Head, eyes and ENT: Nontraumatic and normocephalic. Pupils equally reacting to light and accommodation. Oral mucosa moist.  Neck: Supple. No JVD, carotid bruit or thyromegaly.  Lymphatics: No lymphadenopathy.  Respiratory system: Clear to auscultation. No increased work of breathing.  Cardiovascular system: S1 and S2 heard, RRR. No JVD, murmurs, gallops, clicks or pedal edema.  Gastrointestinal system: Abdomen is nondistended, soft and nontender. Normal bowel sounds heard. No organomegaly or masses appreciated.  Rectal  Exam: external hemorrhoids with bleeding.     Central nervous system: Alert and oriented. No focal neurological deficits.  Extremities: Symmetric 5 x 5 power. Peripheral pulses symmetrically felt.   Skin: No rashes or acute findings.  Musculoskeletal system: Negative exam.  Psychiatry: Pleasant and cooperative.  Labs on Admission:  Basic Metabolic Panel: Recent Labs  Lab 12/25/17 0945 12/25/17 1004  NA 135 138  K 3.5 3.5  CL 104 104  CO2 21*  --   GLUCOSE 103* 98  BUN 19 17  CREATININE 1.00 1.00  CALCIUM 8.4*  --    Liver Function Tests: Recent Labs  Lab 12/25/17 0945  AST 28  ALT 22  ALKPHOS 37*  BILITOT 0.2*  PROT 6.0*  ALBUMIN 3.6   No results for input(s): LIPASE, AMYLASE in the last 168 hours. No results for input(s): AMMONIA in the last 168 hours. CBC: Recent Labs  Lab 12/24/17 1555 12/25/17 0945 12/25/17 1004  WBC 5.5 4.9  --   NEUTROABS 2.3 2.6  --   HGB 7.3* 6.1* 7.1*  HCT 22.3* 19.3* 21.0*  MCV 88.1 87.7  --   PLT 336 371  --    Cardiac Enzymes: No results for input(s): CKTOTAL, CKMB, CKMBINDEX, TROPONINI in the last 168 hours.  BNP (last 3 results) No results for input(s): PROBNP in the last 8760 hours. CBG: No results for input(s): GLUCAP in the last 168 hours.  Radiological Exams on Admission: No results found.  EKG: Independently reviewed.   Assessment/Plan Active Problems:   Adult ADHD   Generalized anxiety disorder   Hemorrhoids   Rectal bleeding   Constipation   Symptomatic anemia   GERD (gastroesophageal reflux disease)   Tobacco use  1. Symptomatic anemia-admit for workup blood work and red blood cell transfusion.  The patient will need ongoing management per GI recommendations.  He is hemodynamically stable at this time and will follow closely.  The patient will be monitored with serial CBC testing.  In addition, anemia workup has been ordered. 2. Severe rectal hemorrhoids-the patient will likely need a general surgery  evaluation for hemorrhoidectomy consideration.  Will defer until GI can evaluate him. 3. Chronic constipation-continue laxative therapy. 4. Generalized anxiety disorder- resume home medications. 5. Adult ADHD-resume home medications. 6. Tobacco use-counseled on smoking cessation, will offer nicotine patch if needed for cravings. 7. GERD-Protonix ordered for GI protection.  DVT Prophylaxis: SCDs Code Status: Full Family Communication: Bedside Disposition Plan: Home when medically stabilized  Time spent: 69 minutes  Irwin Brakeman, MD [facility] Pager [phone]  If 7PM-7AM, please contact night-coverage [email or web address] Password [email or web address] 12/25/2017, 11:11 AM

## 2017-12-25 NOTE — ED Notes (Signed)
Report given to 300 RN at this time.  

## 2017-12-25 NOTE — ED Provider Notes (Signed)
[facility] EMERGENCY DEPARTMENT Provider Note   CSN: [id number] Arrival date & time: 12/25/17  0920     History   Chief Complaint Chief Complaint  Patient presents with  . Rectal Bleeding    HPI Ronald Mccarty is a 37 y.o. male.  Patient has a history of rectal bleeding from hemorrhoids.  Patient was seen by his family doctor and then seen by GI yesterday for rectal bleeding.  His hemoglobin came back 7.1 yesterday and he was told to come to the emergency department because he is feeling bad.  Patient states that his last bowel movement he did not see any blood   The history is provided by the patient.  Rectal Bleeding  Quality:  Bright red Amount:  Moderate Timing:  Constant Chronicity:  New Context: not anal fissures   Similar prior episodes: yes   Relieved by:  None tried Worsened by:  Nothing Associated symptoms: no abdominal pain     Past Medical History:  Diagnosis Date  . ADHD   . Anxiety   . GERD (gastroesophageal reflux disease)   . Hemorrhoids   . Tobacco use     Patient Active Problem List   Diagnosis Date Noted  . Symptomatic anemia 12/25/2017  . Hemorrhoids 12/24/2017  . Rectal bleeding 12/24/2017  . Constipation 12/24/2017  . Adult ADHD 04/16/2017  . Generalized anxiety disorder 04/16/2017    Past Surgical History:  Procedure Laterality Date  . COLONOSCOPY  2014   [place]  . WISDOM TOOTH EXTRACTION  2001       Home Medications    Prior to Admission medications   Medication Sig Start Date End Date Taking? Authorizing Provider  amphetamine-dextroamphetamine (ADDERALL XR) 30 MG 24 hr capsule Take 1 capsule (30 mg total) by mouth daily. 12/10/17   Mikey Kirschner, MD  hydrocortisone (ANUSOL-HC) 2.5 % rectal cream Place 1 application rectally 2 (two) times daily. 12/10/17   Mikey Kirschner, MD  Na Sulfate-K Sulfate-Mg Sulf 17.5-3.13-1.6 GM/177ML SOLN Take 1 kit by mouth as directed. 12/24/17   Rourk, Cristopher Estimable, MD  pantoprazole  (PROTONIX) 40 MG tablet Take 1 tablet (40 mg total) by mouth daily. 09/22/17 09/22/18  Mikey Kirschner, MD    Family History Family History  Problem Relation Age of Onset  . Colon cancer Neg Hx     Social History Social History   Tobacco Use  . Smoking status: Current Every Day Smoker    Packs/day: 1.00    Years: 20.00    Pack years: 20.00    Types: Cigarettes    Start date: 10/28/1996  . Smokeless tobacco: Never Used  Substance Use Topics  . Alcohol use: Yes    Frequency: Never    Comment: occ  . Drug use: No     Allergies   Patient has no known allergies.   Review of Systems Review of Systems  Constitutional: Positive for fatigue. Negative for appetite change.  HENT: Negative for congestion, ear discharge and sinus pressure.   Eyes: Negative for discharge.  Respiratory: Negative for cough.   Cardiovascular: Negative for chest pain.  Gastrointestinal: Positive for hematochezia. Negative for abdominal pain and diarrhea.  Genitourinary: Negative for frequency and hematuria.       Rectal bleeding  Musculoskeletal: Negative for back pain.  Skin: Negative for rash.  Neurological: Negative for seizures and headaches.  Psychiatric/Behavioral: Negative for hallucinations.     Physical Exam Updated Vital Signs BP (!) 141/81 (BP Location: Right Arm)  Pulse 94   Temp 97.8 F (36.6 C) (Oral)   Resp 19   Ht _0  (1.753 m)   Wt 92.5 kg (204 lb)   BMI 30.13 kg/m   Physical Exam  Constitutional: He is oriented to person, place, and time. He appears well-developed.  HENT:  Head: Normocephalic.  Eyes: Conjunctivae and EOM are normal. No scleral icterus.  Neck: Neck supple. No thyromegaly present.  Cardiovascular: Normal rate and regular rhythm. Exam reveals no gallop and no friction rub.  No murmur heard. Pulmonary/Chest: No stridor. He has no wheezes. He has no rales. He exhibits no tenderness.  Abdominal: He exhibits no distension. There is no tenderness.  There is no rebound.  Musculoskeletal: Normal range of motion. He exhibits no edema.  Lymphadenopathy:    He has no cervical adenopathy.  Neurological: He is oriented to person, place, and time. He exhibits normal muscle tone. Coordination normal.  Skin: No rash noted. No erythema.  Psychiatric: He has a normal mood and affect. His behavior is normal.     ED Treatments / Results  Labs (all labs ordered are listed, but only abnormal results are displayed) Labs Reviewed  CBC WITH DIFFERENTIAL/PLATELET - Abnormal; Notable for the following components:      Result Value   RBC 2.20 (*)    Hemoglobin 6.1 (*)    HCT 19.3 (*)    All other components within normal limits  COMPREHENSIVE METABOLIC PANEL - Abnormal; Notable for the following components:   CO2 21 (*)    Glucose, Bld 103 (*)    Calcium 8.4 (*)    Total Protein 6.0 (*)    Alkaline Phosphatase 37 (*)    Total Bilirubin 0.2 (*)    All other components within normal limits  I-STAT CHEM 8, ED - Abnormal; Notable for the following components:   Calcium, Ion 1.11 (*)    Hemoglobin 7.1 (*)    HCT 21.0 (*)    All other components within normal limits  TYPE AND SCREEN  PREPARE RBC (CROSSMATCH)  ABO/RH    EKG  EKG Interpretation None       Radiology No results found.  Procedures Procedures (including critical care time)  Medications Ordered in ED Medications  0.9 %  sodium chloride infusion (not administered)  sodium chloride 0.9 % bolus 1,000 mL (1,000 mLs Intravenous New Bag/Given 12/25/17 0958)     Initial Impression / Assessment and Plan / ED Course  I have reviewed the triage vital signs and the nursing notes.  Pertinent labs & imaging results that were available during my care of the patient were reviewed by me and considered in my medical decision making (see chart for details). CRITICAL CARE Performed by: Milton Ferguson Total critical care time: 40 minutes Critical care time was exclusive of separately  billable procedures and treating other patients. Critical care was necessary to treat or prevent imminent or life-threatening deterioration. Critical care was time spent personally by me on the following activities: development of treatment plan with patient and/or surrogate as well as nursing, discussions with consultants, evaluation of patient's response to treatment, examination of patient, obtaining history from patient or surrogate, ordering and performing treatments and interventions, ordering and review of laboratory studies, ordering and review of radiographic studies, pulse oximetry and re-evaluation of patient's condition.    Patient with weakness dizziness and anemia.  Patient's hemoglobin is 6.1 here.  He will be transfused and GI will follow patient in the hospital with medicine admission  Final Clinical Impressions(s) / ED Diagnoses   Final diagnoses:  Rectal bleeding    ED Discharge Orders    None       Milton Ferguson, MD 12/25/17 1100

## 2017-12-25 NOTE — Telephone Encounter (Signed)
I called the pt per Dr.Steve Lukings request. Left a message asked if he could please return call.

## 2017-12-25 NOTE — ED Notes (Signed)
Date and time results received: 12/25/17 10:13 AM  (use smartphrase ".now" to insert current time)  Test: HGB Critical Value: 6.1  Name of Provider Notified: [place]  Orders Received? Or Actions Taken?: Orders Received - See Orders for details

## 2017-12-25 NOTE — ED Triage Notes (Signed)
Pt c/o bright red rectal bleeding with clots and lightheadedness x 2 weeks. Pt has known hemorrhoids. Pt saw GI doctor yesterday and was called this morning to come to ED due to hemoglobin level of 7.3. Pt reports he feels as if he is going to pass out when he stands up.

## 2017-12-25 NOTE — Telephone Encounter (Signed)
Left a message on wife # asked that he r/c.

## 2017-12-26 ENCOUNTER — Encounter (HOSPITAL_COMMUNITY): Payer: Self-pay | Admitting: *Deleted

## 2017-12-26 ENCOUNTER — Inpatient Hospital Stay (HOSPITAL_COMMUNITY): Payer: 59 | Admitting: Anesthesiology

## 2017-12-26 ENCOUNTER — Encounter (HOSPITAL_COMMUNITY)
Admission: EM | Disposition: A | Discharge status: Other Acute Inpatient Hospital | Payer: Self-pay | Source: Home / Self Care | Attending: Family Medicine

## 2017-12-26 DIAGNOSIS — E559 Vitamin D deficiency, unspecified: Secondary | ICD-10-CM | POA: Diagnosis present

## 2017-12-26 DIAGNOSIS — K921 Melena: Secondary | ICD-10-CM

## 2017-12-26 DIAGNOSIS — K643 Fourth degree hemorrhoids: Principal | ICD-10-CM

## 2017-12-26 HISTORY — PX: COLONOSCOPY WITH PROPOFOL: SHX5780

## 2017-12-26 LAB — TYPE AND SCREEN
ABO/RH(D): O POS
Antibody Screen: NEGATIVE
Unit division: 0
Unit division: 0

## 2017-12-26 LAB — CBC
HEMATOCRIT: 22.5 % — AB (ref 39.0–52.0)
HEMOGLOBIN: 7.3 g/dL — AB (ref 13.0–17.0)
MCH: 28.2 pg (ref 26.0–34.0)
MCHC: 32.4 g/dL (ref 30.0–36.0)
MCV: 86.9 fL (ref 78.0–100.0)
Platelets: 320 10*3/uL (ref 150–400)
RBC: 2.59 MIL/uL — AB (ref 4.22–5.81)
RDW: 13.7 % (ref 11.5–15.5)
WBC: 4.2 10*3/uL (ref 4.0–10.5)

## 2017-12-26 LAB — IRON AND TIBC
Iron: 25 ug/dL — ABNORMAL LOW (ref 45–182)
Saturation Ratios: 7 % — ABNORMAL LOW (ref 17.9–39.5)
TIBC: 372 ug/dL (ref 250–450)
UIBC: 347 ug/dL

## 2017-12-26 LAB — BPAM RBC
BLOOD PRODUCT EXPIRATION DATE: 201903192359
BLOOD PRODUCT EXPIRATION DATE: 201903282359
ISSUE DATE / TIME: 201902281134
ISSUE DATE / TIME: 201902281358
UNIT TYPE AND RH: 5100
Unit Type and Rh: 5100

## 2017-12-26 LAB — FERRITIN: Ferritin: 4 ng/mL — ABNORMAL LOW (ref 24–336)

## 2017-12-26 LAB — PROTIME-INR
INR: 1
Prothrombin Time: 13.1 seconds (ref 11.4–15.2)

## 2017-12-26 LAB — APTT: aPTT: 24 seconds (ref 24–36)

## 2017-12-26 LAB — RETICULOCYTES
RBC.: 2.59 MIL/uL — AB (ref 4.22–5.81)
RETIC CT PCT: 3.2 % — AB (ref 0.4–3.1)
Retic Count, Absolute: 82.9 10*3/uL (ref 19.0–186.0)

## 2017-12-26 LAB — VITAMIN B12: VITAMIN B 12: 510 pg/mL (ref 180–914)

## 2017-12-26 LAB — FOLATE: Folate: 15.4 ng/mL (ref >5.9)

## 2017-12-26 LAB — TSH: TSH: 1.353 u[IU]/mL (ref 0.350–4.500)

## 2017-12-26 LAB — VITAMIN D 25 HYDROXY (VIT D DEFICIENCY, FRACTURES): Vit D, 25-Hydroxy: 13.7 ng/mL — ABNORMAL LOW (ref 30.0–100.0)

## 2017-12-26 SURGERY — COLONOSCOPY WITH PROPOFOL
Anesthesia: Monitor Anesthesia Care

## 2017-12-26 MED ORDER — LORAZEPAM 2 MG/ML IJ SOLN: 0.5000 mg | INTRAMUSCULAR | Status: DC | PRN

## 2017-12-26 MED ORDER — SODIUM CHLORIDE 0.9 % IV SOLN
INTRAVENOUS | Status: DC
  Administered 2017-12-26: 10:00:00 via INTRAVENOUS

## 2017-12-26 MED ORDER — MIDAZOLAM HCL 2 MG/2ML IJ SOLN
1.0000 mg | INTRAMUSCULAR | Status: AC
  Administered 2017-12-26: 2 mg via INTRAVENOUS

## 2017-12-26 MED ORDER — HYDROCODONE-ACETAMINOPHEN 5-325 MG PO TABS
1.0000 | ORAL_TABLET | Freq: Once | ORAL | Status: AC
  Administered 2017-12-26: 1 via ORAL
  Filled 2017-12-26: qty 1

## 2017-12-26 MED ORDER — MIDAZOLAM HCL 5 MG/5ML IJ SOLN
INTRAMUSCULAR | Status: DC | PRN
  Administered 2017-12-26 (×2): 1 mg via INTRAVENOUS

## 2017-12-26 MED ORDER — MIDAZOLAM HCL 2 MG/2ML IJ SOLN
INTRAMUSCULAR | Status: AC
  Filled 2017-12-26: qty 2

## 2017-12-26 MED ORDER — FENTANYL CITRATE (PF) 100 MCG/2ML IJ SOLN
INTRAMUSCULAR | Status: DC | PRN
  Administered 2017-12-26: 25 ug via INTRAVENOUS

## 2017-12-26 MED ORDER — VITAMIN D (ERGOCALCIFEROL) 1.25 MG (50000 UNIT) PO CAPS
50000.0000 [IU] | ORAL_CAPSULE | ORAL | Status: DC
  Administered 2017-12-27: 50000 [IU] via ORAL
  Filled 2017-12-26: qty 1

## 2017-12-26 MED ORDER — PROPOFOL 500 MG/50ML IV EMUL
INTRAVENOUS | Status: DC | PRN
  Administered 2017-12-26 (×3): via INTRAVENOUS
  Administered 2017-12-26: 150 ug/kg/min via INTRAVENOUS

## 2017-12-26 MED ORDER — PROPOFOL 10 MG/ML IV BOLUS
INTRAVENOUS | Status: AC
  Filled 2017-12-26: qty 40

## 2017-12-26 MED ORDER — FENTANYL CITRATE (PF) 100 MCG/2ML IJ SOLN
25.0000 ug | Freq: Once | INTRAMUSCULAR | Status: AC
  Administered 2017-12-26: 25 ug via INTRAVENOUS

## 2017-12-26 MED ORDER — PROPOFOL 10 MG/ML IV BOLUS
INTRAVENOUS | Status: AC
  Filled 2017-12-26: qty 20

## 2017-12-26 MED ORDER — LACTATED RINGERS IV SOLN
INTRAVENOUS | Status: DC
  Administered 2017-12-26: 13:00:00 via INTRAVENOUS

## 2017-12-26 MED ORDER — LIDOCAINE HCL 2 % EX GEL
1.0000 "application " | Freq: Four times a day (QID) | CUTANEOUS | Status: DC
  Administered 2017-12-26 – 2017-12-27 (×2): 1 via TOPICAL
  Filled 2017-12-26 (×11): qty 5

## 2017-12-26 MED ORDER — FENTANYL CITRATE (PF) 100 MCG/2ML IJ SOLN
INTRAMUSCULAR | Status: AC
  Filled 2017-12-26: qty 2

## 2017-12-26 MED ORDER — ALPRAZOLAM 0.25 MG PO TABS
0.2500 mg | ORAL_TABLET | Freq: Once | ORAL | Status: AC
  Administered 2017-12-26: 0.25 mg via ORAL
  Filled 2017-12-26: qty 1

## 2017-12-26 MED ORDER — LIDOCAINE HCL 2 % EX GEL
CUTANEOUS | Status: AC
  Filled 2017-12-26: qty 30

## 2017-12-26 NOTE — Progress Notes (Signed)
Patient has coimpleted entire prep and 2 enemas. First enema was clear, second had some staining to it (per the patient). Complains of headache due to not being able to drink. Will start Rich 50 mL/hr. RN notified to give prn Tylenol.

## 2017-12-26 NOTE — H&P (Signed)
Primary Care Physician:  Mikey Kirschner, MD Primary Gastroenterologist:  Dr. Oneida Alar  Pre-Procedure History & Physical: HPI:  Ronald Mccarty is a 37 y.o. male here for  RECTAL BLEEDING/pain.  Past Medical History:  Diagnosis Date  . ADHD   . Anxiety   . GERD (gastroesophageal reflux disease)   . Hemorrhoids   . Tobacco use     Past Surgical History:  Procedure Laterality Date  . COLONOSCOPY  2014   [place]  . WISDOM TOOTH EXTRACTION  2001    Prior to Admission medications   Medication Sig Start Date End Date Taking? Authorizing Provider  acetaminophen (TYLENOL) 500 MG tablet Take 1,000 mg by mouth every 6 (six) hours as needed for moderate pain.   Yes [provider]  amphetamine-dextroamphetamine (ADDERALL XR) 30 MG 24 hr capsule Take 1 capsule (30 mg total) by mouth daily. 12/10/17  Yes Mikey Kirschner, MD  hydrocortisone (ANUSOL-HC) 2.5 % rectal cream Place 1 application rectally 2 (two) times daily. 12/10/17  Yes Mikey Kirschner, MD  pantoprazole (PROTONIX) 40 MG tablet Take 1 tablet (40 mg total) by mouth daily. 09/22/17 09/22/18 Yes Mikey Kirschner, MD    Allergies as of 12/25/2017 - Review Complete 12/25/2017  Allergen Reaction Noted  . Aspirin Nausea Only 12/25/2017    Family History  Problem Relation Age of Onset  . Colon cancer Neg Hx     Social History   Socioeconomic History  . Marital status: Single    Spouse name: Not on file  . Number of children: Not on file  . Years of education: Not on file  . Highest education level: Not on file  Social Needs  . Financial resource strain: Not on file  . Food insecurity - worry: Not on file  . Food insecurity - inability: Not on file  . Transportation needs - medical: Not on file  . Transportation needs - non-medical: Not on file  Occupational History  . Not on file  Tobacco Use  . Smoking status: Current Every Day Smoker    Packs/day: 1.00    Years: 20.00    Pack years: 20.00   Types: Cigarettes    Start date: 10/28/1996  . Smokeless tobacco: Never Used  Substance and Sexual Activity  . Alcohol use: Yes    Frequency: Never    Comment: occ  . Drug use: No  . Sexual activity: Not on file  Other Topics Concern  . Not on file  Social History Narrative  . Not on file    Review of Systems: See HPI, otherwise negative ROS   Physical Exam: BP 119/75   Pulse 86   Temp 98.9 F (37.2 C) (Oral)   Resp (!) 26   Ht 5\' 9"  (1.753 m)   Wt 204 lb (92.5 kg)   SpO2 98%   BMI 30.13 kg/m  General:   Alert,  pleasant and cooperative in NAD Head:  Normocephalic and atraumatic. Neck:  Supple; Lungs:  Clear throughout to auscultation.    Heart:  Regular rate and rhythm. Abdomen:  Soft, nontender and nondistended. Normal bowel sounds, without guarding, and without rebound.   Neurologic:  Alert and  oriented x4;  grossly normal neurologically.  Impression/Plan:    RECTAL BLEEDING/pain  PLAN:  1. TCS/? Hemorrhoid banding TODAY DISCUSSED PROCEDURE, BENEFITS, & RISKS: < 1% chance of medication reaction, bleeding, perforation, PELVIC VEIN SEPSIS, or rupture of spleen/liver.

## 2017-12-26 NOTE — Transfer of Care (Signed)
Immediate Anesthesia Transfer of Care Note  Patient: Ronald Mccarty  Procedure(s) Performed: COLONOSCOPY WITH PROPOFOL (N/A )  Patient Location: PACU  Anesthesia Type:MAC  Level of Consciousness: awake, alert , oriented and patient cooperative  Airway & Oxygen Therapy: Patient Spontanous Breathing and Patient connected to nasal cannula oxygen  Post-op Assessment: Report given to RN and Post -op Vital signs reviewed and stable  Post vital signs: Reviewed and stable  Last Vitals:  Vitals:   12/26/17 1315 12/26/17 1320  BP: 122/72 124/75  Pulse:    Resp: 13 (!) 25  Temp:    SpO2: 99% 99%    Last Pain:  Vitals:   12/26/17 1225  TempSrc: Oral  PainSc: 5       Patients Stated Pain Goal: 5 (20/94/70 9628)  Complications: No apparent anesthesia complications

## 2017-12-26 NOTE — Progress Notes (Signed)
PROGRESS NOTE    Ronald Mccarty  QBH:[id number]  DOB: 06-19-1981  DOA: 12/25/2017 PCP: Mikey Kirschner, MD   Brief Admission Hx: 37 y.o. male smoker with generalized anxiety disorder, former weight lifter with chronic constipation and a significant problem with hemorrhoids who reported that they have been much worse over the past several weeks.  In addition, he has had difficulty with bloody stools.  The patient had seen his primary care provider for this and had been referred to GI for evaluation.   MDM/Assessment & Plan:    1. Symptomatic anemia-admitted for workup blood work and red blood cell transfusion.  Unfortunately his hemoglobin is not improved after 2 units PRBC and he had ongoing bleeding thru the evening.  He is prepped and scheduled for colonoscopy today.  There is some concern about colon cancer.  The patient will need ongoing management per GI recommendations.  He is hemodynamically stable at this time and will follow closely.   In addition, anemia workup has been ordered. 2. Vitamin D Deficiency - very low Vit D level noted, will order oral replacement when he can take p.o. again.   3. Severe rectal hemorrhoids-He is having a colonoscopy today and will get a better idea of the extent of disease.   4. Chronic constipation-continue laxative therapy. 5. Generalized anxiety disorder- resume home medications.  Added IV lorazepam for acute anxiety symptoms.  6. Adult ADHD-resume home medications. 7. Tobacco use-counseled on smoking cessation, will offer nicotine patch if needed for cravings. 8. GERD-Protonix ordered for GI protection.  DVT Prophylaxis: SCDs Code Status: Full Family Communication: Bedside Disposition Plan: Home when medically stabilized  Consultants:  GI   Procedures:  colonoscopy  Subjective: The patient is worried and upset that he may have colon cancer.  He did complete the prep.  He is anxious and complaining of ongoing headaches. He  continued to have a lot of rectal bleeding thru the night.   Objective: Vitals:   12/25/17 1600 12/25/17 1657 12/25/17 2301 12/26/17 0611  BP: 117/63 117/61 (!) 106/59 116/64  Pulse: 89 84 80 86  Resp: (!) 22 18 17 20   Temp:  99.1 F (37.3 C) 98.5 F (36.9 C) 98.8 F (37.1 C)  TempSrc:  Oral Oral Oral  SpO2: 98% 100% 100% 99%  Weight:      Height:        Intake/Output Summary (Last 24 hours) at 12/26/2017 1043 Last data filed at 12/25/2017 2310 Gross per 24 hour  Intake 2876 ml  Output 500 ml  Net 2376 ml   Filed Weights   12/25/17 0931  Weight: 92.5 kg (204 lb)     REVIEW OF SYSTEMS  As per history otherwise all reviewed and reported negative  Exam:  General exam: awake, alert, NAD. Cooperative.  Respiratory system: Clear. No increased work of breathing. Cardiovascular system: S1 & S2 heard, RRR. No JVD, murmurs, gallops, clicks or pedal edema. Gastrointestinal system: Abdomen is nondistended, soft and nontender. Normal bowel sounds heard. Central nervous system: Alert and oriented. No focal neurological deficits. Extremities: no CCE.  Data Reviewed: Basic Metabolic Panel: Recent Labs  Lab 12/25/17 0945 12/25/17 1004  NA 135 138  K 3.5 3.5  CL 104 104  CO2 21*  --   GLUCOSE 103* 98  BUN 19 17  CREATININE 1.00 1.00  CALCIUM 8.4*  --    Liver Function Tests: Recent Labs  Lab 12/25/17 0945  AST 28  ALT 22  ALKPHOS 37*  BILITOT  0.2*  PROT 6.0*  ALBUMIN 3.6   No results for input(s): LIPASE, AMYLASE in the last 168 hours. No results for input(s): AMMONIA in the last 168 hours. CBC: Recent Labs  Lab 12/24/17 1555 12/25/17 0945 12/25/17 1004 12/26/17 0503  WBC 5.5 4.9  --  4.2  NEUTROABS 2.3 2.6  --   --   HGB 7.3* 6.1* 7.1* 7.3*  HCT 22.3* 19.3* 21.0* 22.5*  MCV 88.1 87.7  --  86.9  PLT 336 371  --  320   Cardiac Enzymes: No results for input(s): CKTOTAL, CKMB, CKMBINDEX, TROPONINI in the last 168 hours. CBG (last 3)  No results for  input(s): GLUCAP in the last 72 hours. No results found for this or any previous visit (from the past 240 hour(s)).   Studies: No results found.   Scheduled Meds: . amphetamine-dextroamphetamine  30 mg Oral Daily  . hydrocortisone  1 application Rectal BID  . pantoprazole  40 mg Oral Daily  . polyethylene glycol  17 g Oral Daily  . sodium chloride flush  3 mL Intravenous Q12H   Continuous Infusions: . sodium chloride    . sodium chloride 50 mL/hr at 12/26/17 2694    Active Problems:   Adult ADHD   Generalized anxiety disorder   Hemorrhoids   Rectal bleeding   Constipation   Symptomatic anemia   GERD (gastroesophageal reflux disease)   Tobacco use  Time spent:   Irwin Brakeman, MD, FAAFP [facility] Pager [phone]  If 7PM-7AM, please contact night-coverage [email or web address] Password [email or web address] 12/26/2017, 10:43 AM    LOS: 1 day

## 2017-12-26 NOTE — Anesthesia Postprocedure Evaluation (Signed)
Anesthesia Post Note  Patient: Ronald Mccarty  Procedure(s) Performed: COLONOSCOPY WITH PROPOFOL (N/A )  Patient location during evaluation: PACU Anesthesia Type: MAC Level of consciousness: awake and alert, oriented and patient cooperative Pain management: pain level controlled Vital Signs Assessment: post-procedure vital signs reviewed and stable Respiratory status: spontaneous breathing and respiratory function stable Cardiovascular status: stable Postop Assessment: no apparent nausea or vomiting Anesthetic complications: no     Last Vitals:  Vitals:   12/26/17 1320 12/26/17 1418  BP: 124/75 112/61  Pulse:  91  Resp: (!) 25 19  Temp:  36.6 C  SpO2: 99% 100%    Last Pain:  Vitals:   12/26/17 1225  TempSrc: Oral  PainSc: 5                  [name] A

## 2017-12-26 NOTE — Anesthesia Procedure Notes (Addendum)
Procedure Name: MAC Date/Time: 12/26/2017 1:27 PM Performed by: Andree Elk Amy A, CRNA Pre-anesthesia Checklist: Patient identified, Emergency Drugs available, Suction available, Patient being monitored and Timeout performed Patient Re-evaluated:Patient Re-evaluated prior to induction Oxygen Delivery Method: Simple face mask

## 2017-12-26 NOTE — Progress Notes (Signed)
SPOKE WITH DRs.Marland Kitchen BRIDGES AND JENKINS. OFFICAL REQUEST FOR CONSULT ENTERED. DR. Constance Haw WILL SEE PT IN AM AND PLAN FOR HEMORRHOIDECTOMY MON MAR 4.

## 2017-12-26 NOTE — Op Note (Signed)
[facility] Patient Name: Ronald Mccarty Procedure Date: 12/26/2017 1:14 PM MRN: [id number] Date of Birth: [birth year] Attending MD: Barney Drain MD, MD CSN: [id number] Age: 37 Admit Type: Outpatient Procedure:                Colonoscopy, DIAGNOSTIC Indications:              Hematochezia Providers:                Barney Drain MD, MD, Janeece Riggers, RN, Nelma Rothman,                            Technician Referring MD:             Elayne Snare Luking Medicines:                Propofol per Anesthesia Complications:            No immediate complications. Estimated Blood Loss:     Estimated blood loss: none. Procedure:                Pre-Anesthesia Assessment:                           - Prior to the procedure, a History and Physical                            was performed, and patient medications and                            allergies were reviewed. The patient's tolerance of                            previous anesthesia was also reviewed. The risks                            and benefits of the procedure and the sedation                            options and risks were discussed with the patient.                            All questions were answered, and informed consent                            was obtained. Prior Anticoagulants: The patient has                            taken no previous anticoagulant or antiplatelet                            agents. ASA Grade Assessment: II - A patient with                            mild systemic disease. After reviewing the risks  and benefits, the patient was deemed in                            satisfactory condition to undergo the procedure.                            After obtaining informed consent, the colonoscope                            was passed under direct vision. Throughout the                            procedure, the patient's blood pressure, pulse, and                            oxygen saturations  were monitored continuously. The                            EC-[id number] ([id number]) scope was introduced through                            the anus and advanced to the 7 cm into the ileum.                            The colonoscopy was technically difficult and                            complex due to significant looping. Successful                            completion of the procedure was aided by increasing                            the dose of sedation medication, straightening and                            shortening the scope to obtain bowel loop reduction                            and COLOWRAP. The patient tolerated the procedure                            fairly well. The quality of the bowel preparation                            was excellent. The terminal ileum, ileocecal valve,                            appendiceal orifice, and rectum were photographed. Scope In: 1:48:09 PM Scope Out: 2:08:09 PM Scope Withdrawal Time: 0 hours 11 minutes 45 seconds  Total Procedure Duration: 0 hours 20 minutes 0 seconds  Findings:      The terminal ileum appeared normal.      The recto-sigmoid colon, sigmoid  colon, descending colon and splenic       flexure revealed significantly excessive looping.      Internal hemorrhoids were found during perianal exam. The hemorrhoids       were Grade IV (internal hemorrhoids that prolapse and cannot be reduced       manually).      External hemorrhoids were found during perianal exam. The hemorrhoids       were moderate. Impression:               - The examined portion of the ileum was normal.                           - There was significant looping of the LEFT colon.                           - RECTAL BLEEDING DUE TO LARGE/ULCERATED Internal                            hemorrhoids.                           - MODERATE External hemorrhoids. Moderate Sedation:      Per Anesthesia Care Recommendation:           - Repeat colonoscopy at age 44 for  surveillance.                           - Cardiac diet.                           - Continue present medications.                           - Refer to a surgeon at the next available                            appointment.                           - Return patient to hospital ward for ongoing care. Procedure Code(s):        --- Professional ---                           [id number], Colonoscopy, flexible; diagnostic, including                            collection of specimen(s) by brushing or washing,                            when performed (separate procedure) Diagnosis Code(s):        --- Professional ---                           D63.8, Fourth degree hemorrhoids                           K64.4, Residual hemorrhoidal skin tags  K92.1, Melena (includes Hematochezia) CPT copyright 2016 American Medical Association. All rights reserved. The codes documented in this report are preliminary and upon coder review may  be revised to meet current compliance requirements. Barney Drain, MD Barney Drain MD, MD 12/26/2017 2:17:34 PM This report has been signed electronically. Number of Addenda: 0

## 2017-12-26 NOTE — Anesthesia Preprocedure Evaluation (Signed)
Anesthesia Evaluation  Patient identified by MRN, date of birth, ID band Patient awake    Reviewed: Allergy & Precautions, NPO status , Patient's Chart, lab work & pertinent test results  Airway Mallampati: II  TM Distance: >3 FB Neck ROM: Full    Dental  (+) Teeth Intact   Pulmonary Recent URI: am cough., Current Smoker,    breath sounds clear to auscultation       Cardiovascular negative cardio ROS   Rhythm:Regular Rate:Normal     Neuro/Psych PSYCHIATRIC DISORDERS Anxiety    GI/Hepatic GERD  Medicated and Controlled,GI bleed   Endo/Other    Renal/GU      Musculoskeletal   Abdominal   Peds  Hematology  (+) anemia ,   Anesthesia Other Findings   Reproductive/Obstetrics                             Anesthesia Physical Anesthesia Plan  ASA: II  Anesthesia Plan: MAC   Post-op Pain Management:    Induction: Intravenous  PONV Risk Score and Plan:   Airway Management Planned: Simple Face Mask  Additional Equipment:   Intra-op Plan:   Post-operative Plan:   Informed Consent: I have reviewed the patients History and Physical, chart, labs and discussed the procedure including the risks, benefits and alternatives for the proposed anesthesia with the patient or authorized representative who has indicated his/her understanding and acceptance.     Plan Discussed with:   Anesthesia Plan Comments:         Anesthesia Quick Evaluation

## 2017-12-26 NOTE — Plan of Care (Signed)
Nutrition Education Note  Patient had presented with rectal bleeding/pain.There was some concern for potential colon cancer. He is s/p colonoscopy which fortunately did not show any malignant processes, but rather severe hemorrhoids.   Patient seen due to MST in which patient reported unintentional weight loss and poor PO intake due to a decreased appetite. Patient reports this was mostly due to his rectal pain/constipation. Patient, spouse and mother had numerous questions about a general, healthy diet and what could help with constipation. Encounter became impromptu education session.   Pt had been given miralax for a time and had also been taking metamucil to increase his fiber content. RD inquired about hydration and spouse reports the patient does not consume much fluids. He drinks only water and Diet coke. RD reviewed that insufficient fluid intake with fiber powders or miralax will actually make constipation worse, not better. It is imperative he stay well hydrated if he uses these.   Pt also reports eating very quickly. Reviewed that digestion begins in the mouth. He should chew his foods thoroughly to aid in easier digestion.   Patient's spouse reports the patient eats extremely large servings of meat, quite frequently. She shows approximate portion sizes with her hands and it appears patient eats 10-12 oz of meat with his meals, with much smaller portions of vegetables. He does not eat fruit. RD recommended consuming no more than 18 oz of Red meat per weak, per AICR guidelines. Additionally, tough, fibrous meats that are not thoroughly chewed could be worsening his constipation.   Pt says he has difficulty becoming satiated and meat is one of the only items that fills him up. RD advocated for natural fiber, namely fresh vegetables and legumes. Also noted that if the vegetables are eaten raw, they will have more fiber and be more satiating.   Patient and family very appreciative of  information.   No further nutrition needs identified. Patient reports good appetite.   Burtis Junes RD, LDN, CNSC Clinical Nutrition Pager: [phone] 12/26/2017 7:29 PM

## 2017-12-27 ENCOUNTER — Other Ambulatory Visit: Payer: Self-pay | Admitting: Family Medicine

## 2017-12-27 ENCOUNTER — Observation Stay
Admission: AD | Admit: 2017-12-27 | Discharge: 2017-12-28 | Disposition: A | Discharge status: Home / Self Care | Payer: 59 | Source: Ambulatory Visit | Attending: Surgery | Admitting: Surgery

## 2017-12-27 ENCOUNTER — Other Ambulatory Visit: Payer: Self-pay

## 2017-12-27 DIAGNOSIS — K644 Residual hemorrhoidal skin tags: Secondary | ICD-10-CM | POA: Insufficient documentation

## 2017-12-27 DIAGNOSIS — D649 Anemia, unspecified: Secondary | ICD-10-CM | POA: Insufficient documentation

## 2017-12-27 DIAGNOSIS — Z79899 Other long term (current) drug therapy: Secondary | ICD-10-CM | POA: Insufficient documentation

## 2017-12-27 DIAGNOSIS — K649 Unspecified hemorrhoids: Secondary | ICD-10-CM | POA: Diagnosis not present

## 2017-12-27 DIAGNOSIS — K219 Gastro-esophageal reflux disease without esophagitis: Secondary | ICD-10-CM | POA: Diagnosis not present

## 2017-12-27 DIAGNOSIS — Z886 Allergy status to analgesic agent status: Secondary | ICD-10-CM | POA: Insufficient documentation

## 2017-12-27 DIAGNOSIS — K648 Other hemorrhoids: Principal | ICD-10-CM | POA: Insufficient documentation

## 2017-12-27 DIAGNOSIS — F17210 Nicotine dependence, cigarettes, uncomplicated: Secondary | ICD-10-CM | POA: Insufficient documentation

## 2017-12-27 DIAGNOSIS — F909 Attention-deficit hyperactivity disorder, unspecified type: Secondary | ICD-10-CM | POA: Insufficient documentation

## 2017-12-27 DIAGNOSIS — K643 Fourth degree hemorrhoids: Secondary | ICD-10-CM | POA: Diagnosis not present

## 2017-12-27 DIAGNOSIS — K5900 Constipation, unspecified: Secondary | ICD-10-CM | POA: Insufficient documentation

## 2017-12-27 DIAGNOSIS — D62 Acute posthemorrhagic anemia: Secondary | ICD-10-CM | POA: Diagnosis not present

## 2017-12-27 LAB — BASIC METABOLIC PANEL
Anion gap: 7 (ref 5–15)
BUN: 16 mg/dL (ref 6–20)
CO2: 23 mmol/L (ref 22–32)
CREATININE: 0.88 mg/dL (ref 0.61–1.24)
Calcium: 8.2 mg/dL — ABNORMAL LOW (ref 8.9–10.3)
Chloride: 109 mmol/L (ref 101–111)
GFR calc Af Amer: >60 mL/min (ref >60)
GFR calc non Af Amer: >60 mL/min (ref >60)
GLUCOSE: 121 mg/dL — AB (ref 65–99)
Potassium: 4.1 mmol/L (ref 3.5–5.1)
SODIUM: 139 mmol/L (ref 135–145)

## 2017-12-27 LAB — CBC
HCT: 22.9 % — ABNORMAL LOW (ref 39.0–52.0)
Hemoglobin: 7.3 g/dL — ABNORMAL LOW (ref 13.0–17.0)
MCH: 28 pg (ref 26.0–34.0)
MCHC: 31.9 g/dL (ref 30.0–36.0)
MCV: 87.7 fL (ref 78.0–100.0)
Platelets: 322 10*3/uL (ref 150–400)
RBC: 2.61 MIL/uL — AB (ref 4.22–5.81)
RDW: 13.5 % (ref 11.5–15.5)
WBC: 3.7 10*3/uL — ABNORMAL LOW (ref 4.0–10.5)

## 2017-12-27 LAB — SURGICAL PCR SCREEN
MRSA, PCR: NEGATIVE
Staphylococcus aureus: NEGATIVE

## 2017-12-27 LAB — TYPE AND SCREEN
ABO/RH(D): O POS
ANTIBODY SCREEN: NEGATIVE

## 2017-12-27 LAB — HIV ANTIBODY (ROUTINE TESTING W REFLEX): HIV Screen 4th Generation wRfx: NONREACTIVE

## 2017-12-27 MED ORDER — ONDANSETRON 4 MG PO TBDP: 4.0000 mg | ORAL_TABLET | Freq: Four times a day (QID) | ORAL | Status: DC | PRN

## 2017-12-27 MED ORDER — HYDROCORTISONE 2.5 % RE CREA
1.0000 "application " | TOPICAL_CREAM | Freq: Four times a day (QID) | RECTAL | Status: DC
  Administered 2017-12-27: 1 via RECTAL
  Filled 2017-12-27: qty 28.35

## 2017-12-27 MED ORDER — OXYCODONE HCL 5 MG PO TABS
5.0000 mg | ORAL_TABLET | Freq: Four times a day (QID) | ORAL | Status: DC | PRN
  Administered 2017-12-27 – 2017-12-28 (×2): 5 mg via ORAL
  Filled 2017-12-27 (×2): qty 1

## 2017-12-27 MED ORDER — ONDANSETRON HCL 4 MG/2ML IJ SOLN
4.0000 mg | Freq: Four times a day (QID) | INTRAMUSCULAR | Status: DC | PRN
  Administered 2017-12-28: 4 mg via INTRAVENOUS

## 2017-12-27 MED ORDER — POLYETHYLENE GLYCOL 3350 17 G PO PACK: 17.0000 g | PACK | Freq: Every day | ORAL | 0 refills | Status: DC

## 2017-12-27 MED ORDER — ACETAMINOPHEN 325 MG PO TABS: 650.0000 mg | ORAL_TABLET | Freq: Four times a day (QID) | ORAL | Status: DC | PRN

## 2017-12-27 MED ORDER — LACTATED RINGERS IV SOLN
INTRAVENOUS | Status: DC
  Administered 2017-12-28 (×2): via INTRAVENOUS

## 2017-12-27 MED ORDER — HYDROMORPHONE HCL 1 MG/ML IJ SOLN
0.5000 mg | INTRAMUSCULAR | Status: DC | PRN
  Administered 2017-12-27 – 2017-12-28 (×2): 0.5 mg via INTRAVENOUS
  Filled 2017-12-27 (×2): qty 0.5

## 2017-12-27 MED ORDER — HYDROCODONE-ACETAMINOPHEN 5-325 MG PO TABS: ORAL_TABLET | ORAL | 0 refills | Status: DC

## 2017-12-27 MED ORDER — KETOROLAC TROMETHAMINE 30 MG/ML IJ SOLN
30.0000 mg | Freq: Four times a day (QID) | INTRAMUSCULAR | Status: DC
  Administered 2017-12-27: 30 mg via INTRAVENOUS
  Filled 2017-12-27: qty 1

## 2017-12-27 MED ORDER — HYDROCORTISONE ACETATE 25 MG RE SUPP
25.0000 mg | Freq: Two times a day (BID) | RECTAL | Status: DC
  Administered 2017-12-27: 25 mg via RECTAL
  Filled 2017-12-27 (×3): qty 1

## 2017-12-27 MED ORDER — PANTOPRAZOLE SODIUM 40 MG IV SOLR
40.0000 mg | Freq: Every day | INTRAVENOUS | Status: DC
  Administered 2017-12-27: 40 mg via INTRAVENOUS
  Filled 2017-12-27: qty 40

## 2017-12-27 MED ORDER — POLYETHYLENE GLYCOL 3350 17 G PO PACK: 17.0000 g | PACK | Freq: Every day | ORAL | Status: DC | PRN

## 2017-12-27 MED ORDER — LIDOCAINE-HYDROCORTISONE ACE 3-2.5 % RE KIT: PACK | RECTAL | 0 refills | Status: DC

## 2017-12-27 MED ORDER — IBUPROFEN 600 MG PO TABS: 600.0000 mg | ORAL_TABLET | Freq: Three times a day (TID) | ORAL | 0 refills | Status: DC | PRN

## 2017-12-27 NOTE — Addendum Note (Signed)
Addended by: Danie Binder on: 12/27/2017 12:06 PM   Modules accepted: Orders

## 2017-12-27 NOTE — Addendum Note (Signed)
Addended by: Danie Binder on: 12/27/2017 10:17 AM   Modules accepted: Orders

## 2017-12-27 NOTE — Telephone Encounter (Addendum)
Pt would like to see surgery at [place]), [place], Dx: RECTAL BLEEDING/GRADE IV HEMORRHOIDS. RX FOR APOTHECARY HEMORRHOID CREAM AND VICODIN #25 SENT TO [facility]. OPV IN 3 MOS E30 HEMORRHOIDS.

## 2017-12-27 NOTE — H&P (Signed)
Date of Admission:  12/27/2017  Reason for Admission:  Bleeding hemorrhoids  History of Present Illness: Ronald Mccarty is a 37 y.o. male transferred from [facility] today for bleeding hemorrhoids.  The patient has a long-standing history of troubles with hemorrhoids, for about 15 years he reports.  He does have bowel habits that are risk factors, including sitting for long period of time on the toilet, constipation, and straining.  He has tried Miralax and Metamucil and just recently was prescribed Linzess by his gastroenterologist, who he saw on 2/27.  He had been having more significant problems over the past two weeks and has been having more frequent and more severe bleeding.  This was associated with lightheadedness as well.  The gastroenterologist ordered labs and he was found to have a low Hgb of 7.3.  He presented to [facility] ED on 2/28 and repeat CBC showed a Hgb of 6.1.  He was admitted to the medical team.  He was given two units of pRBC transfusion and his Hgb has been stable at 7.1-7.3 for the past 3 days.  He had a colonoscopy done on 3/1 which showed grade 4 internal hermorrhoids and moderate external hemorrhoids.  He has had very small blood amount with bowel movements since then.  GI recommended surgery for his hemorrhoids and the patient requested to be transferred to [facility] for surgical evaluation and management.   Past Medical History: Past Medical History:  Diagnosis Date  . ADHD   . Anxiety   . GERD (gastroesophageal reflux disease)   . Hemorrhoids   . Tobacco use      Past Surgical History: Past Surgical History:  Procedure Laterality Date  . COLONOSCOPY  2014   [facility]  . WISDOM TOOTH EXTRACTION  2001    Home Medications: Prior to Admission medications   Medication Sig Start Date End Date Taking? Authorizing Provider  acetaminophen (TYLENOL) 500 MG tablet Take 1,000 mg by mouth every 6 (six) hours as needed for moderate pain.    [provider]   amphetamine-dextroamphetamine (ADDERALL XR) 30 MG 24 hr capsule Take 1 capsule (30 mg total) by mouth daily. 12/10/17   Mikey Kirschner, MD  HYDROcodone-acetaminophen (NORCO/VICODIN) 5-325 MG tablet 1 EVERY 6 H PRN FOR PAIN 12/27/17   Fields, Marga Melnick, MD  pantoprazole (PROTONIX) 40 MG tablet Take 1 tablet (40 mg total) by mouth daily. 09/22/17 09/22/18  Mikey Kirschner, MD  polyethylene glycol (MIRALAX / Floria Raveling) packet Take 17 g by mouth daily. 12/28/17   Kathie Dike, MD    Allergies: Allergies  Allergen Reactions  . Aspirin Nausea Only    Patient prefers not to take; causes stomach distress    Social History:  reports that he has been smoking cigarettes.  He started smoking about 21 years ago. He has a 20.00 pack-year smoking history. he has never used smokeless tobacco. He reports that he drinks alcohol. He reports that he does not use drugs.   Family History: Family History  Problem Relation Age of Onset  . Colon cancer Neg Hx     Review of Systems: Review of Systems  Constitutional: Negative for chills and fever.  HENT: Negative for hearing loss.   Eyes: Negative for blurred vision.  Respiratory: Negative for shortness of breath.   Cardiovascular: Negative for chest pain.  Gastrointestinal: Positive for blood in stool and constipation. Negative for abdominal pain, nausea and vomiting.  Genitourinary: Negative for dysuria.  Musculoskeletal: Negative for myalgias.  Skin: Negative  for rash.  Neurological:       Lightheadedness  Psychiatric/Behavioral: Negative for depression.  All other systems reviewed and are negative.   Physical Exam BP 125/65   Pulse 75   Temp 98.2 F (36.8 C) (Oral)   Resp 16   Ht 5\' 9"  (1.753 m)   Wt 92.5 kg (204 lb)   BMI 30.13 kg/m  CONSTITUTIONAL: No acute distress HEENT:  Normocephalic, atraumatic, extraocular motion intact. NECK: Trachea is midline, and there is no jugular venous distension.  RESPIRATORY:  Lungs are clear, and  breath sounds are equal bilaterally. Normal respiratory effort without pathologic use of accessory muscles. CARDIOVASCULAR: Heart is regular without murmurs, gallops, or rubs. GI: The abdomen is soft, nondistended, nontender to palpation.  RECTAL:  Visible hemorrhoidal tissue, internal and external.  No gross bleeding.  Did not perform digital exam given his recent significant bleed, as to not irritate the tissue further. MUSCULOSKELETAL:  Normal muscle strength and tone in all four extremities.  No peripheral edema or cyanosis. SKIN: Skin turgor is normal. There are no pathologic skin lesions.  NEUROLOGIC:  Motor and sensation is grossly normal.  Cranial nerves are grossly intact. PSYCH:  Alert and oriented to person, place and time. Affect is normal.   Labs from [facility] 12/27/17: WBC 3.7, Hgb 7.3, Hct 22.9, Plt 322 INR 1.0, PTT 24, PT 13.1 TSH 1.35, Iron 25, Ferritin 4, Folate 15.4 Na 139, K 4.1, Cl 109, CO2 23, BUN 16, Cr 0.88, Gluc 121.  Colonoscopy 12/26/17: -The terminal ileum appeared normal. -The recto-sigmoid colon, sigmoid colon, descending colon and splenic flexure revealed significantly excessive looping. -Internal hemorrhoids were found during perianal exam. The hemorrhoids were Grade IV (internal hemorrhoids that prolapse and cannot be reduced manually). -External hemorrhoids were found during perianal exam. The hemorrhoids were moderate.  Assessment and Plan: This is a 37 y.o. male who presents with bleeding hemorrhoids.  I have reviewed the patient's medical records and his laboratory studies.  Overall has long-standing history of hemorrhoids, and has been having more significant bleeding recently, to the point of being anemic with Hgb 6.1 in the ED on 2/28.  Discussed with the patient that our plan would be to take him to the OR tomorrow for an exam under anesthesia and hemorrhoidectomy.  At this point, there is no emergency and this can be done tomorrow.  He will have a  regular diet tonight and be NPO after midnight with IV fluid hydration.  He will have a type and screen here in case any further blood transfusion is required, but currently he's stable.  Discussed risks of procedure including bleeding, infection, injury to surrounding structures, anal stenosis, and he was willing to proceed.      Melvyn Neth, [place]

## 2017-12-27 NOTE — Discharge Summary (Addendum)
Physician Discharge Summary  Ronald Mccarty JKK:[id number] DOB: Mar 27, 1981 DOA: 12/25/2017  PCP: Mikey Kirschner, MD  Admit date: 12/25/2017 Discharge date: 12/27/2017  Admitted From: home Disposition:  Transfer to [place] [facility]   Recommendations for Outpatient Follow-up:  1. Transfer to [facility] for further surgical evaluation at patient request  Discharge Condition: stable CODE STATUS: full code Diet recommendation: soft diet  Brief/Interim Summary: 37 year old male who has a history of tobacco use, chronic constipation, presents to the hospital with rectal bleeding.  Found to have a hemoglobin of 6.1 on arrival.  He was transfused 2 units PRBCs with improvement of hemoglobin to 7.3.  This has remained stable.  He was seen by gastroenterology who performed colonoscopy.  He was found to have a large internal and external hemorrhoids.  Recommendations were for surgical management with hemorrhoidectomy.  Patient has requested transfer to [facility] for further surgical evaluation.  Case discussed with Dr. Hampton Abbot, general surgery at [facility] who has accepted the patient in transfer.  Currently his hemoglobin is stable.  He is continued on MiraLAX for constipation.  He is also on hemorrhoidal cream.  Will transfuse as necessary and follow serial hemoglobins.  He is otherwise stable for transfer.  Discharge Diagnoses:  Active Problems:   Adult ADHD   Generalized anxiety disorder   Hemorrhoids   Rectal bleeding   Constipation   Acute blood loss anemia   GERD (gastroesophageal reflux disease)   Tobacco use   Vitamin D deficiency    Discharge Instructions  Discharge Instructions    Diet - low sodium heart healthy   Complete by:  As directed    Increase activity slowly   Complete by:  As directed      Allergies as of 12/27/2017      Reactions   Aspirin Nausea Only   Patient prefers not to take; causes stomach distress      Medication List    TAKE  these medications   acetaminophen 500 MG tablet Commonly known as:  TYLENOL Take 1,000 mg by mouth every 6 (six) hours as needed for moderate pain.   amphetamine-dextroamphetamine 30 MG 24 hr capsule Commonly known as:  ADDERALL XR Take 1 capsule (30 mg total) by mouth daily.   HYDROcodone-acetaminophen 5-325 MG tablet Commonly known as:  NORCO/VICODIN 1 EVERY 6 H PRN FOR PAIN   hydrocortisone 2.5 % rectal cream Commonly known as:  ANUSOL-HC Place 1 application rectally 2 (two) times daily.   Lidocaine-Hydrocortisone Ace 3-2.5 % Kit APPLY TO RECTUM QID FOR 2 WEEKS   pantoprazole 40 MG tablet Commonly known as:  PROTONIX Take 1 tablet (40 mg total) by mouth daily.   polyethylene glycol packet Commonly known as:  MIRALAX / GLYCOLAX Take 17 g by mouth daily. Start taking on:  12/28/2017      Follow-up Information    referral to general surgeon in [place] will be made Follow up.        Danie Binder, MD Follow up.   Specialty:  Gastroenterology Why:  follow up in 3 months Contact information: [address] [place] [address] [phone]        Mikey Kirschner, MD Follow up.   Specialty:  Family Medicine Why:  call office to make appointment Contact information: [place] [address] [phone]          Allergies  Allergen Reactions  . Aspirin Nausea Only    Patient prefers not to take; causes  stomach distress    Consultations:  Gastroenterology   Procedures/Studies: Colonoscopy: - The examined portion of the ileum was normal.                           - There was significant looping of the LEFT colon.                           - RECTAL BLEEDING DUE TO LARGE/ULCERATED Internal                            hemorrhoids.                           - MODERATE External hemorrhoids.     Subjective: Continuing to have rectal pain. Still passing some blood with stools  Discharge Exam: Vitals:   12/26/17 2206  12/27/17 0621  BP: (!) 111/48 (!) 99/57  Pulse: 93 83  Resp: 20 20  Temp: 98.6 F (37 C) 98.7 F (37.1 C)  SpO2: 98% 98%   Vitals:   12/26/17 1448 12/26/17 1500 12/26/17 2206 12/27/17 0621  BP: (!) 144/87 125/80 (!) 111/48 (!) 99/57  Pulse: 82 87 93 83  Resp: '18 19 20 20  ' Temp: 98.3 F (36.8 C) 98.5 F (36.9 C) 98.6 F (37 C) 98.7 F (37.1 C)  TempSrc:      SpO2: 100% 100% 98% 98%  Weight:      Height:        General: Pt is alert, awake, not in acute distress Cardiovascular: RRR, S1/S2 +, no rubs, no gallops Respiratory: CTA bilaterally, no wheezing, no rhonchi Abdominal: Soft, NT, ND, bowel sounds + Extremities: no edema, no cyanosis    The results of significant diagnostics from this hospitalization (including imaging, microbiology, ancillary and laboratory) are listed below for reference.     Microbiology: No results found for this or any previous visit (from the past 240 hour(s)).   Labs: BNP (last 3 results) No results for input(s): BNP in the last 8760 hours. Basic Metabolic Panel: Recent Labs  Lab 12/25/17 0945 12/25/17 1004 12/27/17 0655  NA 135 138 139  K 3.5 3.5 4.1  CL 104 104 109  CO2 21*  --  23  GLUCOSE 103* 98 121*  BUN '19 17 16  ' CREATININE 1.00 1.00 0.88  CALCIUM 8.4*  --  8.2*   Liver Function Tests: Recent Labs  Lab 12/25/17 0945  AST 28  ALT 22  ALKPHOS 37*  BILITOT 0.2*  PROT 6.0*  ALBUMIN 3.6   No results for input(s): LIPASE, AMYLASE in the last 168 hours. No results for input(s): AMMONIA in the last 168 hours. CBC: Recent Labs  Lab 12/24/17 1555 12/25/17 0945 12/25/17 1004 12/26/17 0503 12/27/17 0655  WBC 5.5 4.9  --  4.2 3.7*  NEUTROABS 2.3 2.6  --   --   --   HGB 7.3* 6.1* 7.1* 7.3* 7.3*  HCT 22.3* 19.3* 21.0* 22.5* 22.9*  MCV 88.1 87.7  --  86.9 87.7  PLT 336 371  --  320 322   Cardiac Enzymes: No results for input(s): CKTOTAL, CKMB, CKMBINDEX, TROPONINI in the last 168 hours. BNP: Invalid input(s):  POCBNP CBG: No results for input(s): GLUCAP in the last 168 hours. D-Dimer No results for input(s): DDIMER in the last 72 hours. Hgb A1c No  results for input(s): HGBA1C in the last 72 hours. Lipid Profile No results for input(s): CHOL, HDL, LDLCALC, TRIG, CHOLHDL, LDLDIRECT in the last 72 hours. Thyroid function studies Recent Labs    12/25/17 2346  TSH 1.353   Anemia work up Recent Labs    12/25/17 2346  VITAMINB12 510  FOLATE 15.4  FERRITIN 4*  TIBC 372  IRON 25*  RETICCTPCT 3.2*   Urinalysis No results found for: COLORURINE, APPEARANCEUR, LABSPEC, [place], GLUCOSEU, HGBUR, BILIRUBINUR, KETONESUR, PROTEINUR, UROBILINOGEN, NITRITE, LEUKOCYTESUR Sepsis Labs Invalid input(s): PROCALCITONIN,  WBC,  LACTICIDVEN Microbiology No results found for this or any previous visit (from the past 240 hour(s)).   Time coordinating discharge: Over 30 minutes  SIGNED:   Kathie Dike, MD  [facility] 12/27/2017, 11:25 AM Pager   If 7PM-7AM, please contact night-coverage [email or web address] Password [email or web address]

## 2017-12-27 NOTE — Progress Notes (Addendum)
Patient ID: Ronald Mccarty, male   DOB: 05-Feb-1981, 37 y.o.   MRN: [id number]   Assessment/Plan: ADMITTED WITH RECTAL BLEEDING/ANEMIA DUE TO LARGE IH. CLINICALLY IMPROVED. UNABLE TO BAND HEMORRHOIDS YESTERDAY DUE TO SIZE.  PLAN: 1. DISCUSSED MANAGEMENT OPTIONS WITH PT AND WIFE. WOULD [place]. REFERRAL WILL BE SENT ON MAR 4. 2. APOTHECARY CREAM QID. RX SENT 3. VICODIN ONE Q6H PRN RECTAL PAIN. RX SENT. 4. OPV IN 3 MOS W/ RGA.  GREATER THAN 50% WAS SPENT IN COUNSELING & COORDINATION OF CARE WITH THE PATIENT: DISCUSSED DIFFERENTIAL DIAGNOSIS, PROCEDURE, BENEFITS, RISKS, AND MANAGEMENT OF HEMORRHOIDS/RECTAL BLEEDING. TOTAL ENCOUNTER TIME: 25 MINS.  1204: PT BEING TRANSFERRED TO [place] [facility]. CALLED TO CANCEL RX FOR APOTHECARY CREAM AND VICODIN.  Subjective: Since I last evaluated the patient HE HAS HAD LESS RECTAL BLEEDING. STILL HAVING RECTAL PAIN. LIDOCAINE JELLY MAKES HIS ANUS BURN. No ADDITIONAL questions or concerns.   Objective: Vital signs in last 24 hours: Vitals:   12/26/17 2206 12/27/17 0621  BP: (!) 111/48 (!) 99/57  Pulse: 93 83  Resp: 20 20  Temp: 98.6 F (37 C) 98.7 F (37.1 C)  SpO2: 98% 98%   General appearance: alert, cooperative and no distress Resp: clear to auscultation bilaterally Cardio: regular rate and rhythm GI: soft, non-tender; bowel sounds normal;   Lab Results:  Hb 7.3    Studies/Results:NONE   Medications: I have reviewed the patient's current medications.   LOS: 5 days

## 2017-12-27 NOTE — Progress Notes (Signed)
Transferred to [facility] for surgical evaluation. Report given to [facility]. Family at bedside Vital signs stable. Transported to awaiting facility via [place].

## 2017-12-27 NOTE — Telephone Encounter (Signed)
PT REQUESTED TRANSFER INSTEAD OF DISCHARGE. CANCEL REFERRAL.

## 2017-12-28 ENCOUNTER — Observation Stay: Payer: 59 | Admitting: Anesthesiology

## 2017-12-28 ENCOUNTER — Encounter
Admission: AD | Disposition: A | Discharge status: Home / Self Care | Payer: Self-pay | Source: Ambulatory Visit | Attending: Surgery

## 2017-12-28 DIAGNOSIS — K649 Unspecified hemorrhoids: Secondary | ICD-10-CM | POA: Diagnosis not present

## 2017-12-28 DIAGNOSIS — K644 Residual hemorrhoidal skin tags: Secondary | ICD-10-CM | POA: Diagnosis not present

## 2017-12-28 DIAGNOSIS — K648 Other hemorrhoids: Secondary | ICD-10-CM | POA: Diagnosis not present

## 2017-12-28 DIAGNOSIS — K219 Gastro-esophageal reflux disease without esophagitis: Secondary | ICD-10-CM | POA: Diagnosis not present

## 2017-12-28 HISTORY — PX: HEMORRHOID SURGERY: SHX153

## 2017-12-28 LAB — BASIC METABOLIC PANEL
Anion gap: 6 (ref 5–15)
BUN: 12 mg/dL (ref 6–20)
CHLORIDE: 109 mmol/L (ref 101–111)
CO2: 25 mmol/L (ref 22–32)
Calcium: 8.2 mg/dL — ABNORMAL LOW (ref 8.9–10.3)
Creatinine, Ser: 0.91 mg/dL (ref 0.61–1.24)
GFR calc Af Amer: >60 mL/min (ref >60)
GFR calc non Af Amer: >60 mL/min (ref >60)
GLUCOSE: 101 mg/dL — AB (ref 65–99)
POTASSIUM: 4 mmol/L (ref 3.5–5.1)
Sodium: 140 mmol/L (ref 135–145)

## 2017-12-28 LAB — CBC
HEMATOCRIT: 21.8 % — AB (ref 40.0–52.0)
HEMOGLOBIN: 7.5 g/dL — AB (ref 13.0–18.0)
MCH: 29.2 pg (ref 26.0–34.0)
MCHC: 34.3 g/dL (ref 32.0–36.0)
MCV: 85.2 fL (ref 80.0–100.0)
Platelets: 338 10*3/uL (ref 150–440)
RBC: 2.55 MIL/uL — AB (ref 4.40–5.90)
RDW: 13.4 % (ref 11.5–14.5)
WBC: 3.5 10*3/uL — ABNORMAL LOW (ref 3.8–10.6)

## 2017-12-28 LAB — MAGNESIUM: MAGNESIUM: 2.1 mg/dL (ref 1.7–2.4)

## 2017-12-28 SURGERY — HEMORRHOIDECTOMY
Anesthesia: General

## 2017-12-28 MED ORDER — OXYCODONE HCL 5 MG PO TABS: 5.0000 mg | ORAL_TABLET | Freq: Four times a day (QID) | ORAL | 0 refills | Status: DC | PRN

## 2017-12-28 MED ORDER — HYDROMORPHONE HCL 1 MG/ML IJ SOLN
INTRAMUSCULAR | Status: AC
  Filled 2017-12-28: qty 1

## 2017-12-28 MED ORDER — FENTANYL CITRATE (PF) 100 MCG/2ML IJ SOLN
50.0000 ug | Freq: Once | INTRAMUSCULAR | Status: AC
  Administered 2017-12-28: 50 ug via INTRAVENOUS

## 2017-12-28 MED ORDER — PROPOFOL 10 MG/ML IV BOLUS
INTRAVENOUS | Status: DC | PRN
  Administered 2017-12-28: 200 mg via INTRAVENOUS

## 2017-12-28 MED ORDER — BUPIVACAINE-EPINEPHRINE (PF) 0.5% -1:200000 IJ SOLN
INTRAMUSCULAR | Status: AC
  Filled 2017-12-28: qty 30

## 2017-12-28 MED ORDER — LIDOCAINE HCL (PF) 2 % IJ SOLN
INTRAMUSCULAR | Status: AC
  Filled 2017-12-28: qty 10

## 2017-12-28 MED ORDER — PROPOFOL 10 MG/ML IV BOLUS
INTRAVENOUS | Status: AC
  Filled 2017-12-28: qty 20

## 2017-12-28 MED ORDER — GELATIN ABSORBABLE 100 CM EX MISC
CUTANEOUS | Status: AC
  Filled 2017-12-28: qty 1

## 2017-12-28 MED ORDER — FENTANYL CITRATE (PF) 100 MCG/2ML IJ SOLN
INTRAMUSCULAR | Status: DC | PRN
  Administered 2017-12-28: 50 ug via INTRAVENOUS
  Administered 2017-12-28: 100 ug via INTRAVENOUS
  Administered 2017-12-28: 50 ug via INTRAVENOUS

## 2017-12-28 MED ORDER — TAMSULOSIN HCL 0.4 MG PO CAPS
0.4000 mg | ORAL_CAPSULE | Freq: Every day | ORAL | Status: DC
  Administered 2017-12-28: 0.4 mg via ORAL
  Filled 2017-12-28: qty 1

## 2017-12-28 MED ORDER — LIDOCAINE HCL 2 % EX GEL
CUTANEOUS | Status: AC
Start: 2017-12-28 — End: 2017-12-28
  Filled 2017-12-28: qty 10

## 2017-12-28 MED ORDER — TAMSULOSIN HCL 0.4 MG PO CAPS: 0.4000 mg | ORAL_CAPSULE | Freq: Every day | ORAL | 0 refills | Status: DC

## 2017-12-28 MED ORDER — ONDANSETRON HCL 4 MG/2ML IJ SOLN: 4.0000 mg | Freq: Once | INTRAMUSCULAR | Status: DC | PRN

## 2017-12-28 MED ORDER — LIDOCAINE HCL (CARDIAC) 20 MG/ML IV SOLN
INTRAVENOUS | Status: DC | PRN
  Administered 2017-12-28: 100 mg via INTRAVENOUS

## 2017-12-28 MED ORDER — DEXAMETHASONE SODIUM PHOSPHATE 10 MG/ML IJ SOLN
INTRAMUSCULAR | Status: AC
  Filled 2017-12-28: qty 1

## 2017-12-28 MED ORDER — POLYETHYLENE GLYCOL 3350 17 G PO PACK: 17.0000 g | PACK | Freq: Every day | ORAL | 0 refills | Status: DC

## 2017-12-28 MED ORDER — HYDROCORTISONE ACETATE 25 MG RE SUPP: 25.0000 mg | Freq: Two times a day (BID) | RECTAL | 0 refills | Status: DC | PRN

## 2017-12-28 MED ORDER — IBUPROFEN 600 MG PO TABS: 600.0000 mg | ORAL_TABLET | Freq: Three times a day (TID) | ORAL | 0 refills | Status: DC | PRN

## 2017-12-28 MED ORDER — HYDROMORPHONE HCL 1 MG/ML IJ SOLN
INTRAMUSCULAR | Status: DC | PRN
  Administered 2017-12-28: .5 mg via INTRAVENOUS
  Administered 2017-12-28: 0.5 mg via INTRAVENOUS

## 2017-12-28 MED ORDER — SURGIFOAM 100 EX MISC
CUTANEOUS | Status: DC | PRN
  Administered 2017-12-28: 1 via TOPICAL

## 2017-12-28 MED ORDER — DEXAMETHASONE SODIUM PHOSPHATE 10 MG/ML IJ SOLN
INTRAMUSCULAR | Status: DC | PRN
  Administered 2017-12-28: 10 mg via INTRAVENOUS

## 2017-12-28 MED ORDER — FENTANYL CITRATE (PF) 100 MCG/2ML IJ SOLN
INTRAMUSCULAR | Status: AC
  Filled 2017-12-28: qty 2

## 2017-12-28 MED ORDER — LIDOCAINE HCL 2 % EX GEL
CUTANEOUS | Status: DC | PRN
  Administered 2017-12-28: 1

## 2017-12-28 MED ORDER — ONDANSETRON HCL 4 MG/2ML IJ SOLN
INTRAMUSCULAR | Status: AC
  Filled 2017-12-28: qty 2

## 2017-12-28 MED ORDER — MIDAZOLAM HCL 2 MG/2ML IJ SOLN
INTRAMUSCULAR | Status: AC
Start: 2017-12-28 — End: 2017-12-28
  Filled 2017-12-28: qty 2

## 2017-12-28 MED ORDER — BUPIVACAINE LIPOSOME 1.3 % IJ SUSP
INTRAMUSCULAR | Status: AC
  Filled 2017-12-28: qty 20

## 2017-12-28 MED ORDER — MIDAZOLAM HCL 2 MG/2ML IJ SOLN
INTRAMUSCULAR | Status: DC | PRN
  Administered 2017-12-28: 2 mg via INTRAVENOUS

## 2017-12-28 MED ORDER — BUPIVACAINE LIPOSOME 1.3 % IJ SUSP
INTRAMUSCULAR | Status: DC | PRN
  Administered 2017-12-28: 10 mL

## 2017-12-28 MED ORDER — BUPIVACAINE-EPINEPHRINE 0.5% -1:200000 IJ SOLN
INTRAMUSCULAR | Status: DC | PRN
  Administered 2017-12-28: 10 mL

## 2017-12-28 MED ORDER — FENTANYL CITRATE (PF) 100 MCG/2ML IJ SOLN
25.0000 ug | INTRAMUSCULAR | Status: AC | PRN
  Administered 2017-12-28 (×6): 25 ug via INTRAVENOUS

## 2017-12-28 SURGICAL SUPPLY — 28 items
BRIEF STRETCH MATERNITY 2XLG (MISCELLANEOUS) ×3 IMPLANT
CANISTER SUCT 1200ML W/VALVE (MISCELLANEOUS) ×3 IMPLANT
DRAPE LAPAROTOMY 100X77 ABD (DRAPES) ×3 IMPLANT
DRAPE LEGGINS SURG 28X43 STRL (DRAPES) ×3 IMPLANT
DRAPE UNDER BUTTOCK W/FLU (DRAPES) ×3 IMPLANT
ELECT REM PT RETURN 9FT ADLT (ELECTROSURGICAL) ×3
ELECTRODE REM PT RTRN 9FT ADLT (ELECTROSURGICAL) ×1 IMPLANT
GAUZE FLUFF 18X24 1PLY STRL (GAUZE/BANDAGES/DRESSINGS) ×3 IMPLANT
GLOVE SURG SYN 7.0 (GLOVE) ×9 IMPLANT
GLOVE SURG SYN 7.5  E (GLOVE) ×2
GLOVE SURG SYN 7.5 E (GLOVE) ×1 IMPLANT
GOWN STRL REUS W/ TWL LRG LVL3 (GOWN DISPOSABLE) ×2 IMPLANT
GOWN STRL REUS W/TWL LRG LVL3 (GOWN DISPOSABLE) ×4
KIT TURNOVER KIT A (KITS) ×3 IMPLANT
LABEL OR SOLS (LABEL) ×3 IMPLANT
LIGASURE IMPACT 36 18CM CVD LR (INSTRUMENTS) ×3 IMPLANT
NEEDLE HYPO 22GX1.5 SAFETY (NEEDLE) ×3 IMPLANT
NS IRRIG 500ML POUR BTL (IV SOLUTION) ×3 IMPLANT
PACK BASIN MINOR ARMC (MISCELLANEOUS) ×3 IMPLANT
PAD OB MATERNITY 4.3X12.25 (PERSONAL CARE ITEMS) ×3 IMPLANT
PAD PREP 24X41 OB/GYN DISP (PERSONAL CARE ITEMS) ×3 IMPLANT
SOL PREP PVP 2OZ (MISCELLANEOUS) ×3
SOLUTION PREP PVP 2OZ (MISCELLANEOUS) ×1 IMPLANT
SURGILUBE 2OZ TUBE FLIPTOP (MISCELLANEOUS) ×3 IMPLANT
SUT VIC AB 2-0 SH 27 (SUTURE) ×6
SUT VIC AB 2-0 SH 27XBRD (SUTURE) ×3 IMPLANT
SYR 10ML LL (SYRINGE) ×3 IMPLANT
SYR BULB IRRIG 60ML STRL (SYRINGE) ×6 IMPLANT

## 2017-12-28 NOTE — Progress Notes (Signed)
12/28/2017  Subjective: No acute events overnight.  Did not have any significant bleeding overnight.  Hgb stable today.  Vital signs: Temp:  [97.8 F (36.6 C)-98.4 F (36.9 C)] 98.2 F (36.8 C) (03/03 1154) Pulse Rate:  [67-111] 78 (03/03 1154) Resp:  [13-19] 17 (03/03 1154) BP: (125-148)/(63-87) 138/75 (03/03 1154) SpO2:  [97 %-100 %] 99 % (03/03 1154) Weight:  [92.5 kg (204 lb)] 92.5 kg (204 lb) (03/02 1500)   Intake/Output: No intake/output data recorded. Last BM Date: 12/27/17  Physical Exam: Constitutional: No acute distress Rectal:  No significantly enlarge external hemorrhoids, but can see right anterior component bulging out.  No gross blood.  Labs:  Recent Labs    12/27/17 0655 12/28/17 0623  WBC 3.7* 3.5*  HGB 7.3* 7.5*  HCT 22.9* 21.8*  PLT 322 338   Recent Labs    12/27/17 0655 12/28/17 0623  NA 139 140  K 4.1 4.0  CL 109 109  CO2 23 25  GLUCOSE 121* 101*  BUN 16 12  CREATININE 0.88 0.91  CALCIUM 8.2* 8.2*   Recent Labs    12/25/17 2346  LABPROT 13.1  INR 1.00    Imaging: No results found.  Assessment/Plan: 37 yo male with bleeding hemorrhoids  --will take to OR today for exam under anesthesia and hemorrhoidectomy.  Risks of bleeding, infection, and injury to surrounding structure were discussed and he's willing to proceed.  Depending on how he feels post-op, he may potentially be discharged tonight.   Melvyn Neth, [place]

## 2017-12-28 NOTE — Anesthesia Postprocedure Evaluation (Signed)
Anesthesia Post Note  Patient: DEMITRIUS CRASS  Procedure(s) Performed: HEMORRHOIDECTOMY with exam under anesthesia (N/A )  Patient location during evaluation: PACU Anesthesia Type: General Level of consciousness: awake and alert Pain management: pain level controlled Vital Signs Assessment: post-procedure vital signs reviewed and stable Respiratory status: spontaneous breathing and respiratory function stable Cardiovascular status: stable Anesthetic complications: no     Last Vitals:  Vitals:   12/28/17 1154 12/28/17 1228  BP: 138/75 138/83  Pulse: 78 78  Resp: 17   Temp: 36.8 C 36.6 C  SpO2: 99% 100%    Last Pain:  Vitals:   12/28/17 1228  TempSrc: Oral  PainSc:                  KEPHART,WILLIAM K

## 2017-12-28 NOTE — Discharge Summary (Signed)
Patient ID: Ronald Mccarty MRN: [id number] DOB/AGE: 11-02-1980 37 y.o.  Admit date: 12/27/2017 Discharge date: 12/28/2017   Discharge Diagnoses:  Active Problems:   Bleeding hemorrhoids   Procedures:  Exam under anesthesia, hemorrhoidectomy of two columns  Hospital Course:  Patient was admitted on 12/27/17 as a transfer from OSH with significant bleeding from hemorrhoids.  He required 2 units pRBC transfusion at OSH prior to transfer.  He was stable on transfer and his Hgb was stable as well without further bleeding.  He was taken to the OR on 3/3 for exam under anesthesia and hemorrhoidectomy of right anterior and left lateral columns.  He tolerated the procedure well.  Post-operatively, he was having some difficulty with urination but was able to void fully on his own afterwards.  His pain was well controlled and he was deemed ready for discharge.  Consults:  None  Disposition: 01- Home, self-care  Discharge Instructions    Call MD for:  difficulty breathing, headache or visual disturbances   Complete by:  As directed    Call MD for:  persistant nausea and vomiting   Complete by:  As directed    Call MD for:  redness, tenderness, or signs of infection (pain, swelling, redness, odor or green/yellow discharge around incision site)   Complete by:  As directed    Call MD for:  severe uncontrolled pain   Complete by:  As directed    Call MD for:  temperature >100.4   Complete by:  As directed    Diet - low sodium heart healthy   Complete by:  As directed    Discharge instructions   Complete by:  As directed    1.  Take MiraLax once daily and may increase to twice daily as needed for constipation 2.  Take Metamucil once daily and may increase to twice daily as needed for constipation 3.  Use Anusol suppository twice daily as needed for hemorrhoidal pain. 4.  Take Flomax until course completed 5.  Do Sitz baths twice daily to help soothing hemorrhoidal tissue 6.  It is common for some  bleeding to happen after this surgery.  Do call if the bleeding worsens again 7.  May take Motrin or Oxycodone for pain. 8.  May remove gauze and pad dressing later today.  May reapply new gauze dressing as needed.   Driving Restrictions   Complete by:  As directed    Do not drive while taking narcotics for pain control.   Increase activity slowly   Complete by:  As directed    Lifting restrictions   Complete by:  As directed    No specific lifting restrictions, but take it easy for this coming week.     Allergies as of 12/28/2017      Reactions   Aspirin Nausea Only   Patient prefers not to take; causes stomach distress      Medication List    STOP taking these medications   HYDROcodone-acetaminophen 5-325 MG tablet Commonly known as:  NORCO/VICODIN     TAKE these medications   acetaminophen 500 MG tablet Commonly known as:  TYLENOL Take 1,000 mg by mouth every 6 (six) hours as needed for moderate pain.   amphetamine-dextroamphetamine 30 MG 24 hr capsule Commonly known as:  ADDERALL XR Take 1 capsule (30 mg total) by mouth daily.   hydrocortisone 25 MG suppository Commonly known as:  ANUSOL-HC Place 1 suppository (25 mg total) rectally 2 (two) times daily as needed for hemorrhoids  or anal itching.   ibuprofen 600 MG tablet Commonly known as:  ADVIL,MOTRIN Take 1 tablet (600 mg total) by mouth every 8 (eight) hours as needed for fever, mild pain or moderate pain.   oxyCODONE 5 MG immediate release tablet Commonly known as:  Oxy IR/ROXICODONE Take 1 tablet (5 mg total) by mouth every 6 (six) hours as needed for moderate pain.   pantoprazole 40 MG tablet Commonly known as:  PROTONIX Take 1 tablet (40 mg total) by mouth daily.   polyethylene glycol packet Commonly known as:  MIRALAX / GLYCOLAX Take 17 g by mouth daily. May take once daily or twice daily for constipation What changed:  additional instructions   tamsulosin 0.4 MG Caps capsule Commonly known as:   FLOMAX Take 1 capsule (0.4 mg total) by mouth daily. Start taking on:  12/29/2017      Follow-up Information    Saahir Prude, Jacqulyn Bath, MD Follow up in 3 week(s).   Specialty:  Surgery Why:  Follow up in 2-3 weeks. Contact information: [place] [address] [phone]

## 2017-12-28 NOTE — Anesthesia Post-op Follow-up Note (Signed)
Anesthesia QCDR form completed.        

## 2017-12-28 NOTE — Anesthesia Procedure Notes (Signed)
Procedure Name: LMA Insertion Date/Time: 12/28/2017 9:40 AM Performed by: Nelda Marseille, CRNA Pre-anesthesia Checklist: Patient identified, Patient being monitored, Timeout performed, Emergency Drugs available and Suction available Patient Re-evaluated:Patient Re-evaluated prior to induction Oxygen Delivery Method: Circle system utilized Preoxygenation: Pre-oxygenation with 100% oxygen Induction Type: IV induction Ventilation: Mask ventilation without difficulty LMA: LMA inserted LMA Size: 4.0 Tube type: Oral Number of attempts: 1 Placement Confirmation: positive ETCO2 and breath sounds checked- equal and bilateral Tube secured with: Tape Dental Injury: Teeth and Oropharynx as per pre-operative assessment

## 2017-12-28 NOTE — Anesthesia Preprocedure Evaluation (Signed)
Anesthesia Evaluation  Patient identified by MRN, date of birth, ID band Patient awake    Reviewed: Allergy & Precautions, NPO status , Patient's Chart, lab work & pertinent test results  History of Anesthesia Complications Negative for: history of anesthetic complications  Airway Mallampati: II       Dental   Pulmonary neg COPD, Current Smoker,           Cardiovascular (-) hypertension(-) Past MI and (-) CHF (-) dysrhythmias (-) Cardiac Defibrillator      Neuro/Psych neg Seizures Anxiety    GI/Hepatic Neg liver ROS, GERD  Medicated and Controlled,  Endo/Other  neg diabetes  Renal/GU negative Renal ROS     Musculoskeletal   Abdominal   Peds  Hematology   Anesthesia Other Findings   Reproductive/Obstetrics                             Anesthesia Physical Anesthesia Plan  ASA: II  Anesthesia Plan: General   Post-op Pain Management:    Induction: Intravenous  PONV Risk Score and Plan: 1 and Ondansetron  Airway Management Planned: LMA  Additional Equipment:   Intra-op Plan:   Post-operative Plan:   Informed Consent: I have reviewed the patients History and Physical, chart, labs and discussed the procedure including the risks, benefits and alternatives for the proposed anesthesia with the patient or authorized representative who has indicated his/her understanding and acceptance.     Plan Discussed with:   Anesthesia Plan Comments:         Anesthesia Quick Evaluation

## 2017-12-28 NOTE — Transfer of Care (Signed)
Immediate Anesthesia Transfer of Care Note  Patient: Ronald Mccarty  Procedure(s) Performed: HEMORRHOIDECTOMY with exam under anesthesia (N/A )  Patient Location: PACU  Anesthesia Type:General  Level of Consciousness: awake, alert  and oriented  Airway & Oxygen Therapy: Patient Spontanous Breathing and Patient connected to face mask oxygen  Post-op Assessment: Report given to RN and Post -op Vital signs reviewed and stable  Post vital signs: Reviewed and stable  Last Vitals:  Vitals:   12/27/17 2156 12/28/17 0457  BP: 133/68 127/63  Pulse: 87 82  Resp: 18 18  Temp: 36.9 C 36.8 C  SpO2: 99% 100%    Last Pain:  Vitals:   12/28/17 0800  TempSrc:   PainSc: 2          Complications: No apparent anesthesia complications

## 2017-12-28 NOTE — Op Note (Signed)
Procedure Date:  12/28/2017  Pre-operative Diagnosis:  Bleeding internal hemorrhoids  Post-operative Diagnosis:  Bleeding internal hemorrhoids  Procedure:  Exam under Anesthesia, hemorrhoidectomy of two columns  Surgeon:  Melvyn Neth, MD  Anesthesia:  General endotracheal  Estimated Blood Loss:  20 ml  Specimens:  Right anterior and left lateral hemorrhoidal columns  Complications:  None  Indications for Procedure:  This is a 37 y.o. male with diagnosis of bleeding hemorrhoids which required transfusion of 2 units pRBC.  The risks of bleeding, abscess or infection, injury to surrounding structures, and need for further procedures were all discussed with the patient and was willing to proceed.  Description of Procedure: The patient was correctly identified in the preoperative area and brought into the operating room.  The patient was placed supine with VTE prophylaxis in place.  Appropriate time-outs were performed.  Anesthesia was induced and the patient was intubated.  The patient was then placed in high lithotomy position.  The patient's perianal area was prepped and draped in usual sterile fashion.  The anal canal was dilated using one, then two, and finally three fingers.  Exam revealed two enlarged hemorrhoidal columns, one of which was bleeding. The bivalve retractor was placed exposing the right anterior column.  Bovie cautery and LigaSure were used to resect that hemorrhoidal column.  2-0 Vicryl suture was used to close the resected end, starting the column apex, leaving the external corner open for drainage.  The same was done for the left lateral hemorrhoidal columns. The right posterior column was only mildly enlarged and did not have any evidence of bleeding or inflammation, and it was decided to leave it. The anal canal was irrigated and there was no gross bleeding.  Perianal block was performed using combination of Exparel and 0.5% bupivacaine with epi.  A large  gelfoam was rolled with lidocaine gel and placed into the anal canal.  The perianal area was cleaned a dressed with fluff gauze and ABD pad.     The patient was placed back in flat supine position, emerged from anesthesia, extubated, and brought to the recovery room for further management.  The patient tolerated the procedure well and all counts were correct at the end of the case.

## 2017-12-29 ENCOUNTER — Encounter: Payer: Self-pay | Admitting: Surgery

## 2017-12-29 NOTE — Telephone Encounter (Signed)
Colonoscopy cancelled. Called and informed Endo scheduler.

## 2017-12-29 NOTE — Telephone Encounter (Signed)
Don't see any reason to keep it on the schedule.

## 2017-12-29 NOTE — Telephone Encounter (Signed)
May we cancel Colonoscopy that was scheduled for 01/29/18 with [facility]?

## 2017-12-30 ENCOUNTER — Telehealth: Payer: Self-pay | Admitting: Surgery

## 2017-12-30 LAB — SURGICAL PATHOLOGY

## 2017-12-30 NOTE — Telephone Encounter (Signed)
Patient called back and paid for fmla/disability paperwork over the phone, paperwork has been placed in folder up front.

## 2017-12-30 NOTE — Telephone Encounter (Signed)
We received fmla/disability paperwork on patient, I have left a voicemail for the patient to call the office.

## 2017-12-31 ENCOUNTER — Telehealth: Payer: Self-pay | Admitting: *Deleted

## 2017-12-31 NOTE — Telephone Encounter (Signed)
FMLA paperwork completed and faxed at this time.

## 2017-12-31 NOTE — Telephone Encounter (Signed)
Ronald Kirschner, MD         Let him know Dr Richardson Landry reviewed all of his records from hospital , glad overall he is doing better, with sigmificant anemia recommend for next two months pt to take a multivitamin with iron once per day AND an extra iron tab (OTC ask pharmacist) once per day, will help him get over anemia quicker and help his energy    Patient verbalized understanding and scheduled hospital follow up with Dr Richardson Landry next week

## 2018-01-05 ENCOUNTER — Encounter: Payer: Self-pay | Admitting: Family Medicine

## 2018-01-05 ENCOUNTER — Ambulatory Visit (INDEPENDENT_AMBULATORY_CARE_PROVIDER_SITE_OTHER): Payer: 59 | Admitting: Family Medicine

## 2018-01-05 VITALS — BP 110/72 | Temp 98.8°F | Ht 69.0 in | Wt 205.0 lb

## 2018-01-05 DIAGNOSIS — K649 Unspecified hemorrhoids: Secondary | ICD-10-CM

## 2018-01-05 DIAGNOSIS — R7989 Other specified abnormal findings of blood chemistry: Secondary | ICD-10-CM

## 2018-01-05 DIAGNOSIS — D508 Other iron deficiency anemias: Secondary | ICD-10-CM | POA: Diagnosis not present

## 2018-01-05 LAB — POCT HEMOGLOBIN: Hemoglobin: 7 g/dL — AB (ref 14.1–18.1)

## 2018-01-05 NOTE — Patient Instructions (Signed)
Vit D 3  2000 miu  One daily  vitmulti one daily  Iron tab at least two a day three if you can

## 2018-01-05 NOTE — Progress Notes (Signed)
   Subjective:    Patient ID: Ronald Mccarty, male    DOB: 1981-01-16, 37 y.o.   MRN: [id number]  HPIHospital follow up. Admitted on  3/2 with bleeding from hemorrhoids. Had Hemorrhoidectomy on 3/3.  Needs refill on flomax and refill on pain med. Still in a lot of pain.   Results for orders placed or performed in visit on 01/05/18  POCT hemoglobin  Result Value Ref Range   Hemoglobin 7.0 (A) 14.1 - 18.1 g/dL   Hospital record reviewed in presence of patient. Had severe blood loss.  Required 2 transfusions.  Was transferred from [facility] to [facility] after GI workup revealed bleeding hemorrhoids.  Patient notes no further substantial bleeding has had a little slight blood on the tissue.  Notes ongoing fatigue and weakness.  Was not given any instructions to build up his iron.   Complete hospital record reviewed at length.  Also notes substantial fatigue with exertion.  No chest pain no shortness of breath Review of Systems No headache, no major weight loss or weight gain, no chest pain no back pain abdominal pain no change in bowel habits complete ROS otherwise negative     Objective:   Physical Exam   Alert vitals stable, NAD. Blood pressure good on repeat. HEENT normal. Lungs clear. Heart regular rate and rhythm. Abdominal exam benign     Assessment & Plan:   impression status post GI bleed with bleeding hemorrhoids.  SinceOperated upon.  Now doing quite a bit better.  Still quite weak.  Warning signs discussed carefully as far substantial blood loss.  Increase iron tablet to at least 2 and preferably 3/day.  Maintain vitamin with multivitamin daily recheck CBC 8 or 9 days.  Patient had numerous questions about hospitalization.  Vitamin D supplement discussed  . 5 Greater than 50% of this 25 minute face to face visit was spent in counseling and discussion and coordination of care regarding the above diagnosis/diagnosies

## 2018-01-06 ENCOUNTER — Telehealth: Payer: Self-pay | Admitting: Surgery

## 2018-01-06 ENCOUNTER — Telehealth: Payer: Self-pay | Admitting: General Surgery

## 2018-01-06 NOTE — Telephone Encounter (Signed)
Patient left message that he has noticed green mucus when he wipes. Had surgery on 3/10. Please advise

## 2018-01-07 ENCOUNTER — Ambulatory Visit (INDEPENDENT_AMBULATORY_CARE_PROVIDER_SITE_OTHER): Payer: 59 | Admitting: Surgery

## 2018-01-07 ENCOUNTER — Encounter: Payer: Self-pay | Admitting: Surgery

## 2018-01-07 VITALS — BP 138/73 | HR 106 | Temp 98.1°F | Ht 69.0 in | Wt 208.0 lb

## 2018-01-07 DIAGNOSIS — Z09 Encounter for follow-up examination after completed treatment for conditions other than malignant neoplasm: Secondary | ICD-10-CM

## 2018-01-07 NOTE — Telephone Encounter (Signed)
Returned call to patient at this time. He stated that when he wiped he noticed on the tucks pad that he had some green mucus drainage. He denied fever/chills, nausea/vomiting, constipation/diarrhea. He stated that overall he has been feeling well. He did state that he is concerned about his hemoglobin level and that it is still the same as when he was discharged. I verbalized understanding and offered him an appointment to be seen today at 2:15. I accepted and appointment was confirmed.

## 2018-01-07 NOTE — Patient Instructions (Signed)

## 2018-01-07 NOTE — Progress Notes (Signed)
S/p hemorrhoidectomy by Dr. Hampton Abbot due to sympt anemia He has been doing well No more bleeding Some mucus  Pain is ok Path d/w pt in detail  PE  Abd: soft, nt Perineum, no evidence of active infection or bleeding. Hemorrhoidectomy sites healing well  A/P Doing well RTC 3-6 months No complications

## 2018-01-14 ENCOUNTER — Encounter: Payer: 59 | Admitting: Surgery

## 2018-01-15 DIAGNOSIS — K649 Unspecified hemorrhoids: Secondary | ICD-10-CM | POA: Diagnosis not present

## 2018-01-16 LAB — CBC WITH DIFFERENTIAL/PLATELET
BASOS: 0 %
Basophils Absolute: 0 10*3/uL (ref 0.0–0.2)
EOS (ABSOLUTE): 0.1 10*3/uL (ref 0.0–0.4)
EOS: 2 %
HEMATOCRIT: 34 % — AB (ref 37.5–51.0)
Hemoglobin: 10.3 g/dL — ABNORMAL LOW (ref 13.0–17.7)
IMMATURE GRANS (ABS): 0 10*3/uL (ref 0.0–0.1)
IMMATURE GRANULOCYTES: 1 %
LYMPHS: 24 %
Lymphocytes Absolute: 1.3 10*3/uL (ref 0.7–3.1)
MCH: 28.1 pg (ref 26.6–33.0)
MCHC: 30.3 g/dL — ABNORMAL LOW (ref 31.5–35.7)
MCV: 93 fL (ref 79–97)
Monocytes Absolute: 0.5 10*3/uL (ref 0.1–0.9)
Monocytes: 9 %
NEUTROS PCT: 64 %
Neutrophils Absolute: 3.6 10*3/uL (ref 1.4–7.0)
Platelets: 522 10*3/uL — ABNORMAL HIGH (ref 150–379)
RBC: 3.67 x10E6/uL — ABNORMAL LOW (ref 4.14–5.80)
RDW: 18.8 % — ABNORMAL HIGH (ref 12.3–15.4)
WBC: 5.6 10*3/uL (ref 3.4–10.8)

## 2018-01-20 NOTE — Telephone Encounter (Signed)
Disability paperwork faxed to [place] at this time. [phone].Patient notified. Placed in scan folder.

## 2018-01-22 ENCOUNTER — Other Ambulatory Visit (HOSPITAL_COMMUNITY): Payer: 59

## 2018-01-29 ENCOUNTER — Ambulatory Visit (HOSPITAL_COMMUNITY): Admit: 2018-01-29 | Payer: 59 | Admitting: Internal Medicine

## 2018-01-29 ENCOUNTER — Encounter (HOSPITAL_COMMUNITY): Payer: Self-pay

## 2018-01-29 SURGERY — COLONOSCOPY WITH PROPOFOL
Anesthesia: Monitor Anesthesia Care

## 2018-03-24 ENCOUNTER — Ambulatory Visit (INDEPENDENT_AMBULATORY_CARE_PROVIDER_SITE_OTHER): Payer: 59 | Admitting: Nurse Practitioner

## 2018-03-24 ENCOUNTER — Encounter: Payer: Self-pay | Admitting: Nurse Practitioner

## 2018-03-24 VITALS — BP 122/70 | HR 77 | Temp 97.4°F | Ht 69.0 in | Wt 214.0 lb

## 2018-03-24 DIAGNOSIS — K625 Hemorrhage of anus and rectum: Secondary | ICD-10-CM | POA: Diagnosis not present

## 2018-03-24 DIAGNOSIS — D649 Anemia, unspecified: Secondary | ICD-10-CM

## 2018-03-24 DIAGNOSIS — K5900 Constipation, unspecified: Secondary | ICD-10-CM

## 2018-03-24 NOTE — Assessment & Plan Note (Signed)
Acute symptomatic anemia due to acute blood loss related to hemorrhoids.  This is resolving with an appropriate climbing hemoglobin.  He is having his labs checked with primary care next week.  Follow-up in 6 months, call with any worsening symptoms.  In 6 months we can see when his last labs were, what his values were, and determine at that time if further labs are needed.

## 2018-03-24 NOTE — Assessment & Plan Note (Signed)
He did have brief postoperative bleeding.  No significant bleeding since then.  His hemoglobin is rising appropriately.  He is due for recheck on his hemoglobin next week with primary care.  Because of this we will not order labs today.  Follow-up in 6 months.  Call if any worsening symptoms or recurrent bleeding.

## 2018-03-24 NOTE — Assessment & Plan Note (Signed)
Constipation currently doing well.  Approximately [place] 5 stools.  He is on iron supplementation which can cause constipation so he was taking MiraLAX twice daily as well as a stool softener.  He is considering backing off to once a day.  I discussed titrating MiraLAX for soft to easy to pass stools but without constipation or diarrhea.  Limit toilet time to less than 5 minutes (which he has been doing) and avoid straining.  Return for follow-up in 6 months.  Call us if he has any worsening problems or questions before then.

## 2018-03-24 NOTE — Progress Notes (Signed)
Referring Provider: Mikey Kirschner, MD Primary Care Physician:  Mikey Kirschner, MD Primary GI:  Dr. Gala Romney  Chief Complaint  Patient presents with  . Constipation    has BM 1-2 times per day. No longer having rectal bleeding    HPI:   Ronald Mccarty is a 37 y.o. male who presents for follow-up on constipation and rectal bleeding.  The patient was last seen in our office 12/24/2017 for hemorrhoids, rectal bleeding, constipation.  Chronic history of hemorrhoids with flares as well as chronic constipation with straining.  Metamucil had been tried.  History of colonoscopy or endoscopy.  At his last visit he indicated worsening bleeding and lightheadedness, he felt anxiety was partially to blame.  Ongoing bleeding for 1 to 2 weeks with every bowel movement.  Occasionally has sensation of need to defecate but only passes blood.  Sit still on the commode, chronic intermittent constipation, occasionally hard stools and straining.  Tried MiraLAX, Metamucil, stool softener.  Colonoscopy in [place] 5 years ago due to heme positive stool.  Notable rectal hemorrhoid symptoms as well.  No other GI symptoms.  Recommended blood work, request previous colonoscopy report, schedule colonoscopy to prep for possible hemorrhoid banding, Linzess 72 mcg, follow-up in 3 months.  CBC was drawn same day which found significant anemia with a hemoglobin of 7.3.  He was referred to the emergency department.  He was admitted to [facility] from 12/25/2017 to 12/27/2017 rectal bleeding and anemia.  His hemoglobin on admission was found to be 6.1.  He was given 2 units of PRBCs which only improved his hemoglobin to 7.3 and remained stable.  Colonoscopy completed which found large internal and external hemorrhoids and recommended surgical management.  The patient was transferred to [facility] for surgical evaluation.  He was at [facility] from 12/27/2017 through 12/28/2017.  He was taken to the OR on 3 3  and underwent hemorrhoidectomy of right anterior and left lateral hemorrhoid columns.  He was subsequently discharged in stable condition.  His outpatient colonoscopy was canceled as it was done during admission.  His hemoglobin trended upward with most recent value completed 01/15/2018 which found a hemoglobin of 10.3.  Today he states he's doing well overall. His last hgb was improved and he thinks it is getting rechecked in a week at PCP visit. Denies any further rectal bleeding. Constipation is doing ok with MiraLAX while on iron supplementation. Has been using MiraLAX twice daily and this works well for him. Having 1-2 bowel movements daily; rarely misses a day. Stools typically soft and pass easily. Spends 5 or fewer minutes on the toilet. Deneis abdominal pain, N/V, melena, fever, chills, unintentional weight loss. Fatigue and dyspnea is improved (but not resolved). Is back to working. Denies chest pain, dyspnea, dizziness, lightheadedness, syncope, near syncope. Denies any other upper or lower GI symptoms.  Is trying to quit smoking but had a bad reaction to Wellbutrin.  Past Medical History:  Diagnosis Date  . ADHD   . Anxiety   . GERD (gastroesophageal reflux disease)   . Hemorrhoids   . Hemorrhoids    s/p sugical resection of 2 columns at [facility]  . Tobacco use     Past Surgical History:  Procedure Laterality Date  . COLONOSCOPY  2014   [facility]  . COLONOSCOPY WITH PROPOFOL N/A 12/26/2017   Procedure: COLONOSCOPY WITH PROPOFOL;  Surgeon: Danie Binder, MD;  Location: [facility];  Service: Endoscopy;  Laterality: N/A;  .  HEMORRHOID SURGERY N/A 12/28/2017   Procedure: HEMORRHOIDECTOMY with exam under anesthesia;  Surgeon: Olean Ree, MD;  Location: [facility] ORS;  Service: General;  Laterality: N/A;  . WISDOM TOOTH EXTRACTION  2001    Current Outpatient Medications  Medication Sig Dispense Refill  . amphetamine-dextroamphetamine (ADDERALL XR) 30 MG 24 hr capsule Take 1 capsule  (30 mg total) by mouth daily. 30 capsule 0  . hydrocortisone (ANUSOL-HC) 25 MG suppository Place 1 suppository (25 mg total) rectally 2 (two) times daily as needed for hemorrhoids or anal itching. 12 suppository 0  . pantoprazole (PROTONIX) 40 MG tablet Take 1 tablet (40 mg total) by mouth daily. 30 tablet 2  . polyethylene glycol (MIRALAX / GLYCOLAX) packet Take 17 g by mouth daily. May take once daily or twice daily for constipation 14 each 0   No current facility-administered medications for this visit.     Allergies as of 03/24/2018 - Review Complete 03/24/2018  Allergen Reaction Noted  . Aspirin Nausea Only 12/25/2017    Family History  Problem Relation Age of Onset  . Anuerysm Father        gastric  . Colon cancer Neg Hx     Social History   Socioeconomic History  . Marital status: Single    Spouse name: Not on file  . Number of children: Not on file  . Years of education: Not on file  . Highest education level: Not on file  Occupational History  . Not on file  Social Needs  . Financial resource strain: Not on file  . Food insecurity:    Worry: Not on file    Inability: Not on file  . Transportation needs:    Medical: Not on file    Non-medical: Not on file  Tobacco Use  . Smoking status: Current Every Day Smoker    Packs/day: 1.00    Years: 20.00    Pack years: 20.00    Types: Cigarettes    Start date: 10/28/1996  . Smokeless tobacco: Never Used  Substance and Sexual Activity  . Alcohol use: Yes    Frequency: Never    Comment: occ  . Drug use: No  . Sexual activity: Not on file  Lifestyle  . Physical activity:    Days per week: Not on file    Minutes per session: Not on file  . Stress: Not on file  Relationships  . Social connections:    Talks on phone: Not on file    Gets together: Not on file    Attends religious service: Not on file    Active member of club or organization: Not on file    Attends meetings of clubs or organizations: Not on file     Relationship status: Not on file  Other Topics Concern  . Not on file  Social History Narrative  . Not on file    Review of Systems: General: Negative for anorexia, weight loss, fever, chills. ENT: Negative for hoarseness, difficulty swallowing. CV: Negative for chest pain, angina, palpitations, peripheral edema.  Respiratory: Negative for dyspnea at rest, cough, sputum, wheezing.  GI: See history of present illness. Endo: Negative for unusual weight change.  Heme: Negative for bruising or bleeding.   Physical Exam: BP 122/70   Pulse 77   Temp (!) 97.4 F (36.3 C) (Oral)   Ht 5\' 9"  (1.753 m)   Wt 214 lb (97.1 kg)   BMI 31.60 kg/m  General:   Alert and oriented. Pleasant and cooperative. Well-nourished  and well-developed.  Eyes:  Without icterus, sclera clear and conjunctiva pink.  Ears:  Normal auditory acuity. Cardiovascular:  S1, S2 present without murmurs appreciated. Extremities without clubbing or edema. Respiratory:  Clear to auscultation bilaterally. No wheezes, rales, or rhonchi. No distress.  Gastrointestinal:  +BS, soft, non-tender and non-distended. No HSM noted. No guarding or rebound. No masses appreciated.  Rectal:  Deferred  Musculoskalatal:  Symmetrical without gross deformities. Skin:  Intact without significant lesions or rashes. Neurologic:  Alert and oriented x4;  grossly normal neurologically. Psych:  Alert and cooperative. Normal mood and affect. Heme/Lymph/Immune: No significant cervical adenopathy.     03/24/2018 11:20 AM   Disclaimer: This note was dictated with voice recognition software. Similar sounding words can inadvertently be transcribed and may not be corrected upon review.

## 2018-03-24 NOTE — Patient Instructions (Signed)
1. Continue your current medications. 2. You can reduce your MiraLAX to once a day and see if this helps.  You want to avoid constipation/straining, but also want to avoid diarrhea. 3. Continue your stool softener. 4. You can advance her activity based on your tolerance. 5. I wish you the best of luck with quitting smoking.  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 6. Limit toilet time to less than 5 minutes, avoid straining. 7. Follow-up in 6 months. 8. Call us if you have any questions or concerns before then.  At [facility] we value your feedback. You may receive a survey about your visit today. Please share your experience as we strive to create trusting relationships with our patients to provide genuine, compassionate, quality care.  It was good to see you today!  I am glad you are doing better, have a great summer!!

## 2018-03-24 NOTE — Assessment & Plan Note (Signed)
Currently doing well on PPI.  Recommend he continue this, follow-up as needed.

## 2018-03-31 ENCOUNTER — Ambulatory Visit (INDEPENDENT_AMBULATORY_CARE_PROVIDER_SITE_OTHER): Payer: 59 | Admitting: Family Medicine

## 2018-03-31 ENCOUNTER — Encounter: Payer: Self-pay | Admitting: Family Medicine

## 2018-03-31 VITALS — BP 122/86 | Ht 69.0 in | Wt 207.0 lb

## 2018-03-31 DIAGNOSIS — H9202 Otalgia, left ear: Secondary | ICD-10-CM | POA: Diagnosis not present

## 2018-03-31 DIAGNOSIS — G8929 Other chronic pain: Secondary | ICD-10-CM | POA: Diagnosis not present

## 2018-03-31 DIAGNOSIS — K649 Unspecified hemorrhoids: Secondary | ICD-10-CM

## 2018-03-31 DIAGNOSIS — F909 Attention-deficit hyperactivity disorder, unspecified type: Secondary | ICD-10-CM

## 2018-03-31 LAB — POCT HEMOGLOBIN: HEMOGLOBIN: 13.2 g/dL — AB (ref 14.1–18.1)

## 2018-03-31 MED ORDER — AMPHETAMINE-DEXTROAMPHET ER 20 MG PO CP24: ORAL_CAPSULE | ORAL | 0 refills | Status: DC

## 2018-03-31 NOTE — Progress Notes (Signed)
   Subjective:    Patient ID: Ronald Mccarty, male    DOB: [birth year], 37 y.o.   MRN: [id number]  HPI  Patient was seen today for ADD checkup. -weight, vital signs reviewed.  The following items were covered. -Compliance with medication :yes   -Problems with completing homework, paying attention/taking good notes in school: He does not thinks the fast acting adderall is helping  -grades:   - Eating patterns : Good  -sleeping: Good gets up early  -Additional issues or questions: having a weird feeling of " Hot flash" in his left ear. He states some times happens in the right,but rarely.  Later med ckeeping up pt sone in the eve  Eve wakefulness with neds   Left ear sensation gernelly not right ear    Will experience a heat flash sensaion in he ezr, last for ten min, then goes,  Does not afect hearing  Can affect cond and focusing patient also concerned about his anemia.  Patient had substantially low hemoglobin earlier in year.  This occurred from bleeding from place.  Still on iron supplement.  Results for orders placed or performed in visit on 03/31/18  POCT hemoglobin  Result Value Ref Range   Hemoglobin 13.2 (A) 14.1 - 18.1 g/dL    Review of Systems No headache, no major weight loss or weight gain, no chest pain no back pain abdominal pain no change in bowel habits complete ROS otherwise negative     Objective:   Physical Exam  Alert and oriented, vitals reviewed and stable, NAD ENT-TM's and ext canals WNL bilat via otoscopic exam Soft palate, tonsils and post pharynx WNL via oropharyngeal exam Neck-symmetric, no masses; thyroid nonpalpable and nontender Pulmonary-no tachypnea or accessory muscle use; Clear without wheezes via auscultation Card--no abnrml murmurs, rhythm reg and rate WNL Carotid pulses symmetric, without bruits       Assessment & Plan:  1 impression ADHD.  Suboptimal control with current regimen.  States trouble sleeping in the  evening time with the 10 mg added tablet.  Will switch to 40 XR to 26 morning.  Rationale discussed.  2.  Somewhat atypical left ear symptoms.  Etiology unclear.  Discussed.  If persists will recommend repeat referral to ENT saw one in the remote past.  3.  Anemia clinically improved.  1 more month of iron tablets to restore bone marrow some and then a stop rationale discussed  Greater than 50% of this 25 minute face to face visit was spent in counseling and discussion and coordination of care regarding the above diagnosis/diagnosies

## 2018-04-01 ENCOUNTER — Encounter: Payer: 59 | Admitting: Family Medicine

## 2018-04-13 ENCOUNTER — Encounter: Payer: Self-pay | Admitting: Family Medicine

## 2018-04-16 ENCOUNTER — Telehealth: Payer: Self-pay

## 2018-04-16 NOTE — Telephone Encounter (Signed)
Adderal Xr Cap 20 mg is approved and has been ran through insurance and is ready to be picked up per Jeneen Rinks the Software engineer at [employer]. Pt is aware it is ready to be picked up.

## 2018-04-16 NOTE — Telephone Encounter (Signed)
Pharmacy was mistaken and the medication was not ready. I called the insurance company back and expedited the request the pt is aware and we apologized for the delay.We will notify him once the insurance approves or not.

## 2018-04-17 ENCOUNTER — Telehealth: Payer: Self-pay

## 2018-04-17 NOTE — Telephone Encounter (Signed)
Per [employer] [facility] the pt adderall xr 20 mg two po Q am was denied as the plan only covers one daily. Please advise. [employer] [id number] [place] # [id number]. Please advise.

## 2018-04-21 NOTE — Telephone Encounter (Signed)
Discussed with patient. Patient stated he would just like to do the 30mg  XR and at his next visit discuss maybes how to come off the medicine altogether

## 2018-04-21 NOTE — Telephone Encounter (Signed)
Notify pt [employer] refusing to cover higher dose, for the adderall xr formulation max rec adult dose is 30 xr, this is a bit frustrating because clinicians commonly prescribe more than this, however some insur co who like to play hard ball, such as [employer], will commonly deny amnts prescribed beyond fda limits, rec going back to the 30 xr dose if pt likes we could also write for the ten mg non xr again and he take it earlier in the day lunch or even morning

## 2018-04-22 MED ORDER — AMPHETAMINE-DEXTROAMPHET ER 30 MG PO CP24: 30.0000 mg | ORAL_CAPSULE | ORAL | 0 refills | Status: DC

## 2018-04-22 NOTE — Telephone Encounter (Signed)
Ok lets do it enough script to last til next visit

## 2018-04-22 NOTE — Telephone Encounter (Signed)
Prescriptions up front for pick up. Patient notified 

## 2018-04-22 NOTE — Addendum Note (Signed)
Addended by: Dairl Ponder on: 04/22/2018 10:40 AM   Modules accepted: Orders

## 2018-07-13 ENCOUNTER — Encounter: Payer: Self-pay | Admitting: Family Medicine

## 2018-07-24 DIAGNOSIS — M5431 Sciatica, right side: Secondary | ICD-10-CM | POA: Diagnosis not present

## 2018-07-24 DIAGNOSIS — M9903 Segmental and somatic dysfunction of lumbar region: Secondary | ICD-10-CM | POA: Diagnosis not present

## 2018-07-24 DIAGNOSIS — M9905 Segmental and somatic dysfunction of pelvic region: Secondary | ICD-10-CM | POA: Diagnosis not present

## 2018-07-29 ENCOUNTER — Ambulatory Visit (INDEPENDENT_AMBULATORY_CARE_PROVIDER_SITE_OTHER): Payer: 59 | Admitting: Family Medicine

## 2018-07-29 VITALS — BP 122/82 | Ht 69.0 in | Wt 204.8 lb

## 2018-07-29 DIAGNOSIS — Z23 Encounter for immunization: Secondary | ICD-10-CM

## 2018-07-29 DIAGNOSIS — M7702 Medial epicondylitis, left elbow: Secondary | ICD-10-CM

## 2018-07-29 DIAGNOSIS — F909 Attention-deficit hyperactivity disorder, unspecified type: Secondary | ICD-10-CM

## 2018-07-29 DIAGNOSIS — K2100 Gastro-esophageal reflux disease with esophagitis, without bleeding: Secondary | ICD-10-CM

## 2018-07-29 MED ORDER — AMPHETAMINE-DEXTROAMPHET ER 30 MG PO CP24: 30.0000 mg | ORAL_CAPSULE | ORAL | 0 refills | Status: DC

## 2018-07-29 MED ORDER — AMPHETAMINE-DEXTROAMPHETAMINE 10 MG PO TABS: ORAL_TABLET | ORAL | 0 refills | Status: DC

## 2018-07-29 NOTE — Progress Notes (Signed)
   Subjective:    Patient ID: Ronald Mccarty, male    DOB: 19-Jun-1981, 37 y.o.   MRN: [id number]  HPI  Patient arrives for a follow up on ADHD. Pt notes overall the adhd   Med is helping , needs to take th e 10 mg earlier, no obvious side effects from the medications.  Handling well.  Definitely helps.  Would also like a flu shot   indegestion acting up, off and on more days than not, protonix did elp.  Patient has been using PRN acids.  No vomiting.  No weight loss.  Symptoms worse in the evening worse after large meals.    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 Pt does a lot of sanding , works on the furniture, uses left hand s fair amnt           and pain in his back.  Diffuse lumbar in nature.  Sees a chiropractor regularly.  Review of Systems No headache, no major weight loss or weight gain, no chest pain no abdominal pain no change in bowel habits complete ROS otherwise negative     Objective:   Physical Exam  Alert and oriented, vitals reviewed and stable, NAD ENT-TM's and ext canals WNL bilat via otoscopic exam Soft palate, tonsils and post pharynx WNL via oropharyngeal exam Neck-symmetric, no masses; thyroid nonpalpable and nontender Pulmonary-no tachypnea or accessory muscle use; Clear without wheezes via auscultation Card--no abnrml murmurs, rhythm reg and rate WNL Carotid pulses symmetric, without bruits Epigastrium minimal tenderness to deep palpation no no guarding no masses  Positive medial epicondyle tenderness to deep palpation      Assessment & Plan:  Impression 1 ADHD.  Decent control.  Discussed.  To maintain same meds rationale discussed  2.  Heel epicondylitis.  Forearm strap recommended.  No x-rays rationale discussed as needed anti-inflammatories straining the problem #3  3 flare of reflux.  Ongoing need for meds discussed will refill her graph follow-up as scheduled  medications refilled diet exercise discussed also further recommendations as noted above  Informed patient that chronic low back pain is beyond our ability to delve into today

## 2018-07-30 ENCOUNTER — Encounter: Payer: Self-pay | Admitting: Family Medicine

## 2018-07-30 MED ORDER — PANTOPRAZOLE SODIUM 40 MG PO TBEC: 40.0000 mg | DELAYED_RELEASE_TABLET | Freq: Every day | ORAL | 2 refills | Status: DC

## 2018-07-31 ENCOUNTER — Ambulatory Visit: Payer: 59 | Admitting: Family Medicine

## 2018-08-05 DIAGNOSIS — M5431 Sciatica, right side: Secondary | ICD-10-CM | POA: Diagnosis not present

## 2018-08-05 DIAGNOSIS — M9905 Segmental and somatic dysfunction of pelvic region: Secondary | ICD-10-CM | POA: Diagnosis not present

## 2018-08-05 DIAGNOSIS — M9903 Segmental and somatic dysfunction of lumbar region: Secondary | ICD-10-CM | POA: Diagnosis not present

## 2018-08-31 ENCOUNTER — Encounter: Payer: Self-pay | Admitting: Family Medicine

## 2018-08-31 ENCOUNTER — Telehealth: Payer: Self-pay

## 2018-08-31 NOTE — Telephone Encounter (Signed)
Per [employer] [facility] Adderal approved through 09/01/2019. Patient is aware and he will call the pharmacy to set up pick up of medication. He will call us if any troubles.

## 2018-09-16 ENCOUNTER — Encounter: Payer: Self-pay | Admitting: Nurse Practitioner

## 2018-09-16 ENCOUNTER — Ambulatory Visit (INDEPENDENT_AMBULATORY_CARE_PROVIDER_SITE_OTHER): Payer: 59 | Admitting: Nurse Practitioner

## 2018-09-16 VITALS — BP 128/77 | HR 89 | Temp 98.6°F | Ht 69.0 in | Wt 216.0 lb

## 2018-09-16 DIAGNOSIS — K625 Hemorrhage of anus and rectum: Secondary | ICD-10-CM | POA: Diagnosis not present

## 2018-09-16 DIAGNOSIS — K643 Fourth degree hemorrhoids: Secondary | ICD-10-CM | POA: Diagnosis not present

## 2018-09-16 DIAGNOSIS — K5900 Constipation, unspecified: Secondary | ICD-10-CM | POA: Diagnosis not present

## 2018-09-16 NOTE — Assessment & Plan Note (Signed)
Significant bleeding hemorrhoids with hospital admission for hemoglobin of 6.1 last year.  He underwent surgical intervention for hemorrhoidectomy.  1 column of hemorrhoids was unable to be removed.  He has done well since then with increasing hemoglobin.  His last hemoglobin in our system was 10.3.  His primary care last checked his hemoglobin about 1 to 2 months ago and he states it was "basically normal" and they recommended he stop iron.  He has a physical exam early 2020 and they will check his CBC at that time.  Recommend he continue his current medications and follow-up in 1 year.

## 2018-09-16 NOTE — Progress Notes (Signed)
Referring Provider: Mikey Kirschner, MD Primary Care Physician:  Mikey Kirschner, MD Primary GI:  Dr. Gala Romney  Chief Complaint  Patient presents with  . Constipation    ok if takes Miralax  . Hemorrhoids    has bled some when didn't take Miralax    HPI:   Ronald Mccarty is a 37 y.o. male who presents for follow-up on constipation and hemorrhoids.  Patient was last seen in our office 03/24/2018 for rectal bleeding, constipation, symptomatic anemia.  Noted chronic history of hemorrhoids with flares as well as chronic constipation and straining.  Significant anemia with a hemoglobin of 7.3 and referred to the emergency department.  He was admitted to any pad hospital for rectal bleeding and anemia.  His admission hemoglobin was found to be 6.1.  He was transfused x2 units.  Colonoscopy found large internal and external hemorrhoids and recommended surgical management.  The patient was transferred to [facility] where he underwent hemorrhoidectomy and was discharged in stable condition.  His hemoglobin trended upward and most recently and March 2019 was 10.3.  At his last visit he was doing okay overall, hemoglobin continues to improve and has a checked regularly with primary care.  No further rectal bleeding.  Constipation doing well with MiraLAX while on iron supplementation.  He takes MiraLAX twice a day which allows him to have 1-2 bowel movements daily which are consistent with [facility] 4.  Spends 5 or fewer minutes on the toilet.  Fatigue and dyspnea are significantly improved although not fully resolved.  He is back to working.  No other GI symptoms.  Recommended continue current medications, continue stool softener, reduce MiraLAX to once a day, advance activity based on tolerance, follow-up in 6 months.  Today he states he's doing ok overall. He takes MiraLAX daily. Has missed a few days intermittently and when he does he notices a very small amount of bleeding from the hemorrhoid  column they were unable to remove. If he takes MiraLAX he has no bleeding.  He states his primary care checked his blood counts about a couple months ago and they were "essentially normal" and he was advised to stop oral iron.  He did have GERD symptoms when he came of Protonix. However, when he restarted it his symptoms resolved. Otherwise denies abdominal pain, N/V, melena, fever, chills, unintentional weight loss.  Past Medical History:  Diagnosis Date  . ADHD   . Anxiety   . GERD (gastroesophageal reflux disease)   . Hemorrhoids   . Hemorrhoids    s/p sugical resection of 2 columns at [facility].  . Tobacco use     Past Surgical History:  Procedure Laterality Date  . COLONOSCOPY  2014   [place]  . COLONOSCOPY WITH PROPOFOL N/A 12/26/2017   Procedure: COLONOSCOPY WITH PROPOFOL;  Surgeon: Danie Binder, MD;  Location: [facility];  Service: Endoscopy;  Laterality: N/A;  . HEMORRHOID SURGERY N/A 12/28/2017   Procedure: HEMORRHOIDECTOMY with exam under anesthesia;  Surgeon: Olean Ree, MD;  Location: [facility] ORS;  Service: General;  Laterality: N/A;  . WISDOM TOOTH EXTRACTION  2001    Current Outpatient Medications  Medication Sig Dispense Refill  . amphetamine-dextroamphetamine (ADDERALL XR) 30 MG 24 hr capsule Take 1 capsule (30 mg total) by mouth every morning. 30 capsule 0  . amphetamine-dextroamphetamine (ADDERALL) 10 MG tablet One tablet at 1:30 pm 30 tablet 0  . pantoprazole (PROTONIX) 40 MG tablet Take 1 tablet (40 mg total) by mouth  daily. 30 tablet 2  . polyethylene glycol (MIRALAX / GLYCOLAX) packet Take 17 g by mouth daily. May take once daily or twice daily for constipation 14 each 0   No current facility-administered medications for this visit.     Allergies as of 09/16/2018 - Review Complete 09/16/2018  Allergen Reaction Noted  . Aspirin Nausea Only 12/25/2017    Family History  Problem Relation Age of Onset  . Anuerysm Father        gastric  . Colon cancer Neg  Hx     Social History   Socioeconomic History  . Marital status: Single    Spouse name: Not on file  . Number of children: Not on file  . Years of education: Not on file  . Highest education level: Not on file  Occupational History  . Not on file  Social Needs  . Financial resource strain: Not on file  . Food insecurity:    Worry: Not on file    Inability: Not on file  . Transportation needs:    Medical: Not on file    Non-medical: Not on file  Tobacco Use  . Smoking status: Current Every Day Smoker    Packs/day: 1.00    Years: 20.00    Pack years: 20.00    Types: Cigarettes    Start date: 10/28/1996  . Smokeless tobacco: Never Used  Substance and Sexual Activity  . Alcohol use: Not Currently    Frequency: Never    Comment: occ  . Drug use: No  . Sexual activity: Not on file  Lifestyle  . Physical activity:    Days per week: Not on file    Minutes per session: Not on file  . Stress: Not on file  Relationships  . Social connections:    Talks on phone: Not on file    Gets together: Not on file    Attends religious service: Not on file    Active member of club or organization: Not on file    Attends meetings of clubs or organizations: Not on file    Relationship status: Not on file  Other Topics Concern  . Not on file  Social History Narrative  . Not on file    Review of Systems: General: Negative for anorexia, weight loss, fever, chills, fatigue, weakness. ENT: Negative for hoarseness, difficulty swallowing. CV: Negative for chest pain, angina, palpitations, peripheral edema.  Respiratory: Negative for dyspnea at rest, cough, sputum, wheezing.  GI: See history of present illness. Endo: Negative for unusual weight change.  Heme: Negative for bruising or bleeding.   Physical Exam: BP 128/77   Pulse 89   Temp 98.6 F (37 C) (Oral)   Ht 5\' 9"  (1.753 m)   Wt 216 lb (98 kg)   BMI 31.90 kg/m  General:   Alert and oriented. Pleasant and cooperative.  Well-nourished and well-developed.  Eyes:  Without icterus, sclera clear and conjunctiva pink.  Ears:  Normal auditory acuity. Cardiovascular:  S1, S2 present without murmurs appreciated. Extremities without clubbing or edema. Respiratory:  Clear to auscultation bilaterally. No wheezes, rales, or rhonchi. No distress.  Gastrointestinal:  +BS, soft, non-tender and non-distended. No HSM noted. No guarding or rebound. No masses appreciated.  Rectal:  Deferred  Musculoskalatal:  Symmetrical without gross deformities. Neurologic:  Alert and oriented x4;  grossly normal neurologically. Psych:  Alert and cooperative. Normal mood and affect. Heme/Lymph/Immune: No excessive bruising noted.    09/16/2018 3:24 PM   Disclaimer: This note  was dictated with voice recognition software. Similar sounding words can inadvertently be transcribed and may not be corrected upon review.

## 2018-09-16 NOTE — Patient Instructions (Signed)
1. Continue taking your current medications. 2. Return for follow-up in 1 year. 3. Call us if you have any questions or concerns.  At [facility] we value your feedback. You may receive a survey about your visit today. Please share your experience as we strive to create trusting relationships with our patients to provide genuine, compassionate, quality care.  We appreciate your understanding and patience as we review any laboratory studies, imaging, and other diagnostic tests that are ordered as we care for you. Our office policy is 5 business days for review of these results, and any emergent or urgent results are addressed in a timely manner for your best interest. If you do not hear from our office in 1 week, please contact us.   We also encourage the use of MyChart, which contains your medical information for your review as well. If you are not enrolled in this feature, an access code is on this after visit summary for your convenience. Thank you for allowing [place] to be involved in your care.  It was great to meet you today!  I hope you have a Happy Thanksgiving!!

## 2018-09-16 NOTE — Assessment & Plan Note (Signed)
Constipation as the likely source of his rectal bleeding.  He has done well since his surgical excision of his hemorrhoids, limiting toilet time, MiraLAX for constipation.  Recommend he continue all these and follow-up in 1 year.

## 2018-09-16 NOTE — Assessment & Plan Note (Signed)
Noted rare, scant rectal bleeding if he misses MiraLAX.  This is likely due to a column of hemorrhoids that were unable to be surgically removed.  Recommend he continue MiraLAX 1-2 times a day for constipation which should help his rectal bleeding.  He states he does not have rectal bleeding if he takes MiraLAX.  Follow-up in 1 year.

## 2018-09-17 NOTE — Progress Notes (Signed)
CC'D TO PCP °

## 2018-10-25 ENCOUNTER — Other Ambulatory Visit: Payer: Self-pay | Admitting: Family Medicine

## 2018-11-12 DIAGNOSIS — D485 Neoplasm of uncertain behavior of skin: Secondary | ICD-10-CM | POA: Diagnosis not present

## 2018-11-12 DIAGNOSIS — D2262 Melanocytic nevi of left upper limb, including shoulder: Secondary | ICD-10-CM | POA: Diagnosis not present

## 2018-11-12 DIAGNOSIS — B078 Other viral warts: Secondary | ICD-10-CM | POA: Diagnosis not present

## 2018-11-12 DIAGNOSIS — D225 Melanocytic nevi of trunk: Secondary | ICD-10-CM | POA: Diagnosis not present

## 2018-11-20 DIAGNOSIS — M5431 Sciatica, right side: Secondary | ICD-10-CM | POA: Diagnosis not present

## 2018-11-20 DIAGNOSIS — M9905 Segmental and somatic dysfunction of pelvic region: Secondary | ICD-10-CM | POA: Diagnosis not present

## 2018-11-20 DIAGNOSIS — M9903 Segmental and somatic dysfunction of lumbar region: Secondary | ICD-10-CM | POA: Diagnosis not present

## 2018-11-23 ENCOUNTER — Ambulatory Visit (INDEPENDENT_AMBULATORY_CARE_PROVIDER_SITE_OTHER): Payer: 59 | Admitting: Family Medicine

## 2018-11-23 ENCOUNTER — Encounter: Payer: Self-pay | Admitting: Family Medicine

## 2018-11-23 VITALS — BP 126/82 | Ht 69.0 in | Wt 211.8 lb

## 2018-11-23 DIAGNOSIS — Z13220 Encounter for screening for lipoid disorders: Secondary | ICD-10-CM | POA: Diagnosis not present

## 2018-11-23 DIAGNOSIS — Z0000 Encounter for general adult medical examination without abnormal findings: Secondary | ICD-10-CM | POA: Diagnosis not present

## 2018-11-23 DIAGNOSIS — D508 Other iron deficiency anemias: Secondary | ICD-10-CM | POA: Diagnosis not present

## 2018-11-23 DIAGNOSIS — Z79899 Other long term (current) drug therapy: Secondary | ICD-10-CM

## 2018-11-23 DIAGNOSIS — F909 Attention-deficit hyperactivity disorder, unspecified type: Secondary | ICD-10-CM | POA: Diagnosis not present

## 2018-11-23 MED ORDER — AMPHETAMINE-DEXTROAMPHETAMINE 10 MG PO TABS: ORAL_TABLET | ORAL | 0 refills | Status: DC

## 2018-11-23 MED ORDER — AMPHETAMINE-DEXTROAMPHET ER 30 MG PO CP24: 30.0000 mg | ORAL_CAPSULE | ORAL | 0 refills | Status: DC

## 2018-11-23 MED ORDER — PANTOPRAZOLE SODIUM 40 MG PO TBEC: DELAYED_RELEASE_TABLET | ORAL | 11 refills | Status: DC

## 2018-11-23 NOTE — Progress Notes (Signed)
Subjective:    Patient ID: Ronald Mccarty, male    DOB: 1981-03-11, 38 y.o.   MRN: [id number]  HPI  The patient comes in today for a wellness visit.    A review of their health history was completed.  A review of medications was also completed.  Any needed refills; yes on ADHD meds  Eating habits: not as good as should  Falls/  MVA accidents in past few months: no  Regular exercise: just starting back  Specialist pt sees on regular basis: chiropracter  Preventative health issues were discussed.   Additional concerns: Patient would like refills on ADHD medications:                                   Adderall XR 30 mg Qam and Adderalll 10 mg at                                   1:30 pm  Handling the add meds well                                      Blood work to have iron levels rechecked  Occasionally has a litle bleding still, geenrally with stools on the hard side  Smokes around  One half pk per day  So far working on smoking cdssation   ging to exrcise reg very soon   Flu shot alreay given   Had a visit with the derm and they did a skin exam                                                                                                                                         Review of Systems  Constitutional: Negative for activity change, appetite change and fever.  HENT: Negative for congestion and rhinorrhea.   Eyes: Negative for discharge.  Respiratory: Negative for cough and wheezing.   Cardiovascular: Negative for chest pain.  Gastrointestinal: Negative for abdominal pain, blood in stool and vomiting.  Genitourinary: Negative for difficulty urinating and frequency.  Musculoskeletal: Negative for neck pain.  Skin: Negative for rash.  Allergic/Immunologic: Negative for environmental allergies and food allergies.    Neurological: Negative for weakness and headaches.  Psychiatric/Behavioral: Negative for agitation.  All other systems reviewed and are negative.      Objective:   Physical Exam Vitals signs reviewed.  Constitutional:      Appearance: He is well-developed.  HENT:     Head: Normocephalic and atraumatic.     Right Ear: External ear normal.     Left Ear: External ear normal.     Nose: Nose normal.  Eyes:  Pupils: Pupils are equal, round, and reactive to light.  Neck:     Musculoskeletal: Normal range of motion and neck supple.     Thyroid: No thyromegaly.  Cardiovascular:     Rate and Rhythm: Normal rate and regular rhythm.     Heart sounds: Normal heart sounds. No murmur.  Pulmonary:     Effort: Pulmonary effort is normal. No respiratory distress.     Breath sounds: Normal breath sounds. No wheezing.  Abdominal:     General: Bowel sounds are normal. There is no distension.     Palpations: Abdomen is soft. There is no mass.     Tenderness: There is no abdominal tenderness.  Genitourinary:    Penis: Normal.   Musculoskeletal: Normal range of motion.  Lymphadenopathy:     Cervical: No cervical adenopathy.  Skin:    General: Skin is warm and dry.     Findings: No erythema.  Neurological:     Mental Status: He is alert.     Motor: No abnormal muscle tone.  Psychiatric:        Behavior: Behavior normal.        Judgment: Judgment normal.           Assessment & Plan:  Impression wellness exam.  Diet discussed.  Exercise discussed.  Smoking cessation discussed and encouraged vaccines discussed.  Up-to-date on flu shot  2.  ADHD.  Good control discussed maintain same  3.  History of anemia.  Status uncertain.  No further substantial GI bleeding.  See prior notes it was due to a bleeding hemorrhoid.

## 2018-11-25 ENCOUNTER — Encounter: Payer: Self-pay | Admitting: Family Medicine

## 2018-11-25 DIAGNOSIS — Z13220 Encounter for screening for lipoid disorders: Secondary | ICD-10-CM | POA: Diagnosis not present

## 2018-11-25 DIAGNOSIS — D508 Other iron deficiency anemias: Secondary | ICD-10-CM | POA: Diagnosis not present

## 2018-11-25 DIAGNOSIS — Z79899 Other long term (current) drug therapy: Secondary | ICD-10-CM | POA: Diagnosis not present

## 2018-11-26 LAB — BASIC METABOLIC PANEL
BUN/Creatinine Ratio: 9 (ref 9–20)
BUN: 8 mg/dL (ref 6–20)
CALCIUM: 9.7 mg/dL (ref 8.7–10.2)
CO2: 24 mmol/L (ref 20–29)
Chloride: 105 mmol/L (ref 96–106)
Creatinine, Ser: 0.89 mg/dL (ref 0.76–1.27)
GFR calc Af Amer: 125 mL/min/{1.73_m2} (ref >59)
GFR calc non Af Amer: 108 mL/min/{1.73_m2} (ref >59)
Glucose: 85 mg/dL (ref 65–99)
POTASSIUM: 4.8 mmol/L (ref 3.5–5.2)
Sodium: 145 mmol/L — ABNORMAL HIGH (ref 134–144)

## 2018-11-26 LAB — FERRITIN: FERRITIN: 97 ng/mL (ref 30–400)

## 2018-11-26 LAB — HEPATIC FUNCTION PANEL
ALT: 22 IU/L (ref 0–44)
AST: 30 IU/L (ref 0–40)
Albumin: 4.9 g/dL (ref 4.0–5.0)
Alkaline Phosphatase: 53 IU/L (ref 39–117)
BILIRUBIN TOTAL: 0.5 mg/dL (ref 0.0–1.2)
Bilirubin, Direct: 0.13 mg/dL (ref 0.00–0.40)
Total Protein: 6.8 g/dL (ref 6.0–8.5)

## 2018-11-26 LAB — CBC WITH DIFFERENTIAL/PLATELET
BASOS: 1 %
Basophils Absolute: 0 10*3/uL (ref 0.0–0.2)
EOS (ABSOLUTE): 0.2 10*3/uL (ref 0.0–0.4)
Eos: 3 %
Hematocrit: 41.7 % (ref 37.5–51.0)
Hemoglobin: 14 g/dL (ref 13.0–17.7)
IMMATURE GRANS (ABS): 0 10*3/uL (ref 0.0–0.1)
Immature Granulocytes: 0 %
LYMPHS ABS: 2.8 10*3/uL (ref 0.7–3.1)
LYMPHS: 45 %
MCH: 28.9 pg (ref 26.6–33.0)
MCHC: 33.6 g/dL (ref 31.5–35.7)
MCV: 86 fL (ref 79–97)
MONOS ABS: 0.7 10*3/uL (ref 0.1–0.9)
Monocytes: 11 %
NEUTROS ABS: 2.5 10*3/uL (ref 1.4–7.0)
Neutrophils: 40 %
Platelets: 322 10*3/uL (ref 150–450)
RBC: 4.84 x10E6/uL (ref 4.14–5.80)
RDW: 13.1 % (ref 11.6–15.4)
WBC: 6.2 10*3/uL (ref 3.4–10.8)

## 2018-11-26 LAB — LIPID PANEL
CHOLESTEROL TOTAL: 223 mg/dL — AB (ref 100–199)
Chol/HDL Ratio: 3.2 ratio (ref 0.0–5.0)
HDL: 69 mg/dL (ref >39)
LDL CALC: 131 mg/dL — AB (ref 0–99)
TRIGLYCERIDES: 114 mg/dL (ref 0–149)
VLDL CHOLESTEROL CAL: 23 mg/dL (ref 5–40)

## 2018-12-02 DIAGNOSIS — M9903 Segmental and somatic dysfunction of lumbar region: Secondary | ICD-10-CM | POA: Diagnosis not present

## 2018-12-02 DIAGNOSIS — M5431 Sciatica, right side: Secondary | ICD-10-CM | POA: Diagnosis not present

## 2018-12-02 DIAGNOSIS — M9905 Segmental and somatic dysfunction of pelvic region: Secondary | ICD-10-CM | POA: Diagnosis not present

## 2018-12-04 ENCOUNTER — Encounter: Payer: Self-pay | Admitting: Family Medicine

## 2019-03-19 ENCOUNTER — Encounter: Payer: Self-pay | Admitting: Family Medicine

## 2019-03-29 ENCOUNTER — Ambulatory Visit (INDEPENDENT_AMBULATORY_CARE_PROVIDER_SITE_OTHER): Payer: 59 | Admitting: Family Medicine

## 2019-03-29 ENCOUNTER — Other Ambulatory Visit: Payer: Self-pay

## 2019-03-29 ENCOUNTER — Encounter: Payer: Self-pay | Admitting: Family Medicine

## 2019-03-29 VITALS — Wt 210.0 lb

## 2019-03-29 DIAGNOSIS — F909 Attention-deficit hyperactivity disorder, unspecified type: Secondary | ICD-10-CM

## 2019-03-29 NOTE — Progress Notes (Signed)
   Subjective:    Patient ID: Ronald Mccarty, male    DOB: Aug 20, 1981, 38 y.o.   MRN: [id number]  HPI Patient was seen today for ADD checkup.  This patient does have ADD.  Patient takes medications for this.  If this does help control overall symptoms.  Please see below. -weight, vital signs reviewed.  The following items were covered. -Compliance with medication : Adderall 30 mg capsule one each morning; Adderall 10 mg one tab at  1:30 pm  -Problems with completing homework, paying attention/taking good notes in school: n/a  -grades: n/a  - Eating patterns : eats well  -sleeping: sleeping well  -Additional issues or questions: none  Virtual Visit via Video Note  I connected with Ronald Mccarty on 03/29/19 at  3:00 PM EDT by a video enabled telemedicine application and verified that I am speaking with the correct person using two identifiers.  Location: Patient: home Provider: office   I discussed the limitations of evaluation and management by telemedicine and the availability of in person appointments. The patient expressed understanding and agreed to proceed.  History of Present Illness:    Observations/Objective:   Assessment and Plan:   Follow Up Instructions:    I discussed the assessment and treatment plan with the patient. The patient was provided an opportunity to ask questions and all were answered. The patient agreed with the plan and demonstrated an understanding of the instructions.   The patient was advised to call back or seek an in-person evaluation if the symptoms worsen or if the condition fails to improve as anticipated.  I provided 18 minutes of non-face-to-face time during this encounter.   Ronald Males, LPN    Review of Systems No headache, no major weight loss or weight gain, no chest pain no back pain abdominal pain no change in bowel habits complete ROS otherwise negative     Objective:   Physical Exam  Virtual visit       Assessment & Plan:  Impression ADHD.  Medication handling well.  Would like to stop the afternoon short acting dose.  Still sticking with the long-acting.  Exercising fairly well.  Things going good at work.  Excited about his wife expecting a baby

## 2019-03-30 NOTE — Addendum Note (Signed)
Addended by: Vicente Males on: 03/30/2019 08:31 AM   Modules accepted: Orders

## 2019-03-31 ENCOUNTER — Encounter: Payer: Self-pay | Admitting: Family Medicine

## 2019-03-31 MED ORDER — AMPHETAMINE-DEXTROAMPHET ER 30 MG PO CP24: 30.0000 mg | ORAL_CAPSULE | ORAL | 0 refills | Status: DC

## 2019-03-31 NOTE — Addendum Note (Signed)
Addended by: Mikey Kirschner on: 03/31/2019 10:17 AM   Modules accepted: Orders

## 2019-07-21 ENCOUNTER — Encounter: Payer: Self-pay | Admitting: Family Medicine

## 2019-07-21 ENCOUNTER — Ambulatory Visit (INDEPENDENT_AMBULATORY_CARE_PROVIDER_SITE_OTHER): Payer: 59 | Admitting: Family Medicine

## 2019-07-21 DIAGNOSIS — F909 Attention-deficit hyperactivity disorder, unspecified type: Secondary | ICD-10-CM

## 2019-07-21 MED ORDER — AMPHETAMINE-DEXTROAMPHET ER 30 MG PO CP24: 30.0000 mg | ORAL_CAPSULE | ORAL | 0 refills | Status: DC

## 2019-07-21 NOTE — Progress Notes (Signed)
   Subjective:    Patient ID: Ronald KACZYNSKI, male    DOB: 1981/10/03, 38 y.o.   MRN: [id number]  HPI Patient was seen today for ADD checkup.  This patient does have ADD.  Patient takes medications for this.  If this does help control overall symptoms.  Please see below. -weight, vital signs reviewed.  The following items were covered. -Compliance with medication : pt is taking Adderall 30 mg daily; pt stopped taking Adderall 10 mg  -Problems with completing homework, paying attention/taking good notes in school: none  -grades: n/a  - Eating patterns : pt states he is eating well  -sleeping: no issues with sleep  -Additional issues or questions: pt states that he has a baby boy coming in November and he is worried with everything going on with COVID.   Virtual Visit via Video Note  I connected with Ronald Mccarty on 07/21/19 at  8:40 AM EDT by a video enabled telemedicine application and verified that I am speaking with the correct person using two identifiers.  Location: Patient: home Provider: office   I discussed the limitations of evaluation and management by telemedicine and the availability of in person appointments. The patient expressed understanding and agreed to proceed.  History of Present Illness:    Observations/Objective:   Assessment and Plan:   Follow Up Instructions:    I discussed the assessment and treatment plan with the patient. The patient was provided an opportunity to ask questions and all were answered. The patient agreed with the plan and demonstrated an understanding of the instructions.   The patient was advised to call back or seek an in-person evaluation if the symptoms worsen or if the condition fails to improve as anticipated.  I provided 18 minutes of non-face-to-face time during this encounter.  Overall medication doing the job for him.  No obvious side effects.  Compliant with medication.  No longer using afternoon dose.  Under  some increased stress with his spouse facing a delivery soon.  But overall doing well.  Exercising fairly regularly Ronald Males, LPN    Review of Systems No headache, no major weight loss or weight gain, no chest pain no back pain abdominal pain no change in bowel habits complete ROS otherwise negative     Objective:   Physical Exam  Virtual      Assessment & Plan:  Impression ADHD.  Good control discussed maintain same meds positive anxiety.  Overall under good control 4 months worth of meds with

## 2019-07-26 ENCOUNTER — Ambulatory Visit: Payer: 59 | Admitting: Family Medicine

## 2019-08-11 ENCOUNTER — Encounter: Payer: Self-pay | Admitting: Family Medicine

## 2019-08-12 MED ORDER — AMPHETAMINE-DEXTROAMPHETAMINE 10 MG PO TABS: ORAL_TABLET | ORAL | 0 refills | Status: DC

## 2019-08-17 ENCOUNTER — Encounter: Payer: Self-pay | Admitting: Internal Medicine

## 2019-08-26 ENCOUNTER — Encounter: Payer: Self-pay | Admitting: Family Medicine

## 2019-08-30 MED ORDER — CHANTIX STARTING MONTH PAK 0.5 MG X 11 & 1 MG X 42 PO TABS: ORAL_TABLET | ORAL | 2 refills | Status: DC

## 2019-10-17 ENCOUNTER — Other Ambulatory Visit: Payer: Self-pay | Admitting: Family Medicine

## 2019-11-03 ENCOUNTER — Other Ambulatory Visit: Payer: Self-pay

## 2019-11-03 ENCOUNTER — Ambulatory Visit (INDEPENDENT_AMBULATORY_CARE_PROVIDER_SITE_OTHER): Payer: BC Managed Care – PPO | Admitting: Family Medicine

## 2019-11-03 DIAGNOSIS — Z0000 Encounter for general adult medical examination without abnormal findings: Secondary | ICD-10-CM

## 2019-11-03 DIAGNOSIS — F909 Attention-deficit hyperactivity disorder, unspecified type: Secondary | ICD-10-CM

## 2019-11-03 MED ORDER — AMPHETAMINE-DEXTROAMPHETAMINE 10 MG PO TABS: ORAL_TABLET | ORAL | 0 refills | Status: DC

## 2019-11-03 MED ORDER — AMPHETAMINE-DEXTROAMPHET ER 30 MG PO CP24: 30.0000 mg | ORAL_CAPSULE | ORAL | 0 refills | Status: DC

## 2019-11-03 NOTE — Progress Notes (Signed)
   Subjective:  Audio  Patient ID: Ronald Mccarty, male    DOB: [birth year], 39 y.o.   MRN: [id number]  HPIADD check up. Takes adderall xr 30mg  in the morning and adderall 10mg  . Pt states no problems. He does want to get his labs ordered for his upcoming physical in February.  Virtual Visit via Telephone Note  I connected with Ronald Mccarty on 11/03/19 at  3:00 PM EST by telephone and verified that I am speaking with the correct person using two identifiers.  Location: Patient: home Provider: office   I discussed the limitations, risks, security and privacy concerns of performing an evaluation and management service by telephone and the availability of in person appointments. I also discussed with the patient that there may be a patient responsible charge related to this service. The patient expressed understanding and agreed to proceed.   History of Present Illness:    Observations/Objective:   Assessment and Plan:   Follow Up Instructions:    I discussed the assessment and treatment plan with the patient. The patient was provided an opportunity to ask questions and all were answered. The patient agreed with the plan and demonstrated an understanding of the instructions.   The patient was advised to call back or seek an in-person evaluation if the symptoms worsen or if the condition fails to improve as anticipated.  I provided 17 minutes of non-face-to-face time during this encounter.  Patient reports handling ADHD medications overall well.  Definitely helps.  Minimal side effects.  Tolerating well.  Uses faithfully.    Review of Systems No headache, no major weight loss or weight gain, no chest pain no back pain abdominal pain no change in bowel habits complete ROS otherwise negative     Objective:   Physical Exam Virtual       Assessment & Plan:  Impression ADHD.  Overall good control discussed maintain same meds diet exercise because 4 months worth with

## 2019-11-29 ENCOUNTER — Encounter: Payer: Self-pay | Admitting: Family Medicine

## 2019-11-30 LAB — HEPATIC FUNCTION PANEL
ALT: 18 IU/L (ref 0–44)
AST: 24 IU/L (ref 0–40)
Albumin: 4.9 g/dL (ref 4.0–5.0)
Alkaline Phosphatase: 62 IU/L (ref 39–117)
Bilirubin Total: 0.4 mg/dL (ref 0.0–1.2)
Bilirubin, Direct: 0.11 mg/dL (ref 0.00–0.40)
Total Protein: 6.7 g/dL (ref 6.0–8.5)

## 2019-11-30 LAB — LIPID PANEL
Chol/HDL Ratio: 2.9 ratio (ref 0.0–5.0)
Cholesterol, Total: 215 mg/dL — ABNORMAL HIGH (ref 100–199)
HDL: 73 mg/dL (ref >39)
LDL Chol Calc (NIH): 130 mg/dL — ABNORMAL HIGH (ref 0–99)
Triglycerides: 67 mg/dL (ref 0–149)
VLDL Cholesterol Cal: 12 mg/dL (ref 5–40)

## 2019-11-30 LAB — BASIC METABOLIC PANEL
BUN/Creatinine Ratio: 13 (ref 9–20)
BUN: 13 mg/dL (ref 6–24)
CO2: 21 mmol/L (ref 20–29)
Calcium: 9.5 mg/dL (ref 8.7–10.2)
Chloride: 103 mmol/L (ref 96–106)
Creatinine, Ser: 1.04 mg/dL (ref 0.76–1.27)
GFR calc Af Amer: 103 mL/min/{1.73_m2} (ref >59)
GFR calc non Af Amer: 89 mL/min/{1.73_m2} (ref >59)
Glucose: 84 mg/dL (ref 65–99)
Potassium: 4.5 mmol/L (ref 3.5–5.2)
Sodium: 143 mmol/L (ref 134–144)

## 2019-11-30 LAB — CBC WITH DIFFERENTIAL/PLATELET
Basophils Absolute: 0 10*3/uL (ref 0.0–0.2)
Basos: 1 %
EOS (ABSOLUTE): 0.1 10*3/uL (ref 0.0–0.4)
Eos: 2 %
Hematocrit: 44.3 % (ref 37.5–51.0)
Hemoglobin: 14.8 g/dL (ref 13.0–17.7)
Immature Grans (Abs): 0 10*3/uL (ref 0.0–0.1)
Immature Granulocytes: 0 %
Lymphocytes Absolute: 2.9 10*3/uL (ref 0.7–3.1)
Lymphs: 44 %
MCH: 28.6 pg (ref 26.6–33.0)
MCHC: 33.4 g/dL (ref 31.5–35.7)
MCV: 86 fL (ref 79–97)
Monocytes Absolute: 0.7 10*3/uL (ref 0.1–0.9)
Monocytes: 11 %
Neutrophils Absolute: 2.8 10*3/uL (ref 1.4–7.0)
Neutrophils: 42 %
Platelets: 297 10*3/uL (ref 150–450)
RBC: 5.18 x10E6/uL (ref 4.14–5.80)
RDW: 12.7 % (ref 11.6–15.4)
WBC: 6.5 10*3/uL (ref 3.4–10.8)

## 2019-12-01 ENCOUNTER — Ambulatory Visit (INDEPENDENT_AMBULATORY_CARE_PROVIDER_SITE_OTHER): Payer: BC Managed Care – PPO | Admitting: Family Medicine

## 2019-12-01 ENCOUNTER — Encounter: Payer: Self-pay | Admitting: Family Medicine

## 2019-12-01 ENCOUNTER — Other Ambulatory Visit: Payer: Self-pay

## 2019-12-01 VITALS — BP 128/82 | Temp 98.1°F | Ht 69.0 in | Wt 221.4 lb

## 2019-12-01 DIAGNOSIS — Z0000 Encounter for general adult medical examination without abnormal findings: Secondary | ICD-10-CM

## 2019-12-01 NOTE — Progress Notes (Signed)
Subjective:    Patient ID: Ronald Mccarty, male    DOB: 1980-12-07, 39 y.o.   MRN: [id number]  HPI The patient comes in today for a wellness visit.    A review of their health history was completed.  A review of medications was also completed.  Any needed refills; yes  Eating habits: could be better  Falls/  MVA accidents in past few months: none  Regular exercise: needs to get back into that  Specialist pt sees on regular basis: no  Preventative health issues were discussed.   Additional concerns: check 2 places- wart in finger and mole on chest Results for orders placed or performed in visit on 11/03/19  CBC with Diff  Result Value Ref Range   WBC 6.5 3.4 - 10.8 x10E3/uL   RBC 5.18 4.14 - 5.80 x10E6/uL   Hemoglobin 14.8 13.0 - 17.7 g/dL   Hematocrit 44.3 37.5 - 51.0 %   MCV 86 79 - 97 fL   MCH 28.6 26.6 - 33.0 pg   MCHC 33.4 31.5 - 35.7 g/dL   RDW 12.7 11.6 - 15.4 %   Platelets 297 150 - 450 x10E3/uL   Neutrophils 42 Not Estab. %   Lymphs 44 Not Estab. %   Monocytes 11 Not Estab. %   Eos 2 Not Estab. %   Basos 1 Not Estab. %   Neutrophils Absolute 2.8 1.4 - 7.0 x10E3/uL   Lymphocytes Absolute 2.9 0.7 - 3.1 x10E3/uL   Monocytes Absolute 0.7 0.1 - 0.9 x10E3/uL   EOS (ABSOLUTE) 0.1 0.0 - 0.4 x10E3/uL   Basophils Absolute 0.0 0.0 - 0.2 x10E3/uL   Immature Granulocytes 0 Not Estab. %   Immature Grans (Abs) 0.0 0.0 - 0.1 [id number]  Basic Metabolic Panel (BMET)  Result Value Ref Range   Glucose 84 65 - 99 mg/dL   BUN 13 6 - 24 mg/dL   Creatinine, Ser 1.04 0.76 - 1.27 mg/dL   GFR calc non Af Amer 89 >59 mL/min/1.73   GFR calc Af Amer 103 >59 mL/min/1.73   BUN/Creatinine Ratio 13 9 - 20   Sodium 143 134 - 144 mmol/L   Potassium 4.5 3.5 - 5.2 mmol/L   Chloride 103 96 - 106 mmol/L   CO2 21 20 - 29 mmol/L   Calcium 9.5 8.7 - 10.2 mg/dL  Lipid Profile  Result Value Ref Range   Cholesterol, Total 215 (H) 100 - 199 mg/dL   Triglycerides 67 0 - 149 mg/dL   HDL  73 >39 mg/dL   VLDL Cholesterol Cal 12 5 - 40 mg/dL   LDL Chol Calc (NIH) 130 (H) 0 - 99 mg/dL   Chol/HDL Ratio 2.9 0.0 - 5.0 ratio  Hepatic function panel  Result Value Ref Range   Total Protein 6.7 6.0 - 8.5 g/dL   Albumin 4.9 4.0 - 5.0 g/dL   Bilirubin Total 0.4 0.0 - 1.2 mg/dL   Bilirubin, Direct 0.11 0.00 - 0.40 mg/dL   Alkaline Phosphatase 62 39 - 117 IU/L   AST 24 0 - 40 IU/L   ALT 18 0 - 44 IU/L   Diet not the bes, overall not that great  bnot exercising as much these days      Review of Systems  Constitutional: Negative for activity change, appetite change and fever.  HENT: Negative for congestion and rhinorrhea.   Eyes: Negative for discharge.  Respiratory: Negative for cough and wheezing.   Cardiovascular: Negative for chest pain.  Gastrointestinal: Negative for  abdominal pain, blood in stool and vomiting.  Genitourinary: Negative for difficulty urinating and frequency.  Musculoskeletal: Negative for neck pain.  Skin: Negative for rash.  Allergic/Immunologic: Negative for environmental allergies and food allergies.  Neurological: Negative for weakness and headaches.  Psychiatric/Behavioral: Negative for agitation.  All other systems reviewed and are negative.      Objective:   Physical Exam Vitals reviewed.  Constitutional:      Appearance: He is well-developed.  HENT:     Head: Normocephalic and atraumatic.     Right Ear: External ear normal.     Left Ear: External ear normal.     Nose: Nose normal.  Eyes:     Pupils: Pupils are equal, round, and reactive to light.  Neck:     Thyroid: No thyromegaly.  Cardiovascular:     Rate and Rhythm: Normal rate and regular rhythm.     Heart sounds: Normal heart sounds. No murmur.  Pulmonary:     Effort: Pulmonary effort is normal. No respiratory distress.     Breath sounds: Normal breath sounds. No wheezing.  Abdominal:     General: Bowel sounds are normal. There is no distension.     Palpations: Abdomen is  soft. There is no mass.     Tenderness: There is no abdominal tenderness.  Genitourinary:    Penis: Normal.   Musculoskeletal:        General: Normal range of motion.     Cervical back: Normal range of motion and neck supple.  Lymphadenopathy:     Cervical: No cervical adenopathy.  Skin:    General: Skin is warm and dry.     Findings: No erythema.  Neurological:     Mental Status: He is alert.     Motor: No abnormal muscle tone.  Psychiatric:        Behavior: Behavior normal.        Judgment: Judgment normal.           Assessment & Plan:  Impression wellness exam.  Diet discussed.  Exercise discussed.  Blood work discussed.  Patient to work harder on his diet and will attempt to lose some weight on to the spring.  Patient also continues to be managed for ADHD

## 2019-12-04 ENCOUNTER — Encounter: Payer: Self-pay | Admitting: Family Medicine

## 2020-01-31 ENCOUNTER — Other Ambulatory Visit: Payer: Self-pay

## 2020-01-31 ENCOUNTER — Ambulatory Visit (INDEPENDENT_AMBULATORY_CARE_PROVIDER_SITE_OTHER): Payer: BC Managed Care – PPO | Admitting: Family Medicine

## 2020-01-31 DIAGNOSIS — F909 Attention-deficit hyperactivity disorder, unspecified type: Secondary | ICD-10-CM

## 2020-01-31 MED ORDER — AMPHETAMINE-DEXTROAMPHET ER 30 MG PO CP24: 30.0000 mg | ORAL_CAPSULE | ORAL | 0 refills | Status: DC

## 2020-01-31 NOTE — Progress Notes (Signed)
   Subjective:    Patient ID: Ronald Mccarty, male    DOB: 12-Feb-1981, 39 y.o.   MRN: [id number]  HPI Patient calls today for refill on his ADD medication. Patient states everything seems to be going well and not having any issues.  Patient is going back on forth on the Chantix. He states it goes well for a while and then he falls off but is still trying to quit smoking.   Virtual Visit via Video Note  I connected with Ronald Mccarty on 01/31/20 at  3:30 PM EDT by a video enabled telemedicine application and verified that I am speaking with the correct person using two identifiers.  Location: Patient: home Provider: office    I discussed the limitations of evaluation and management by telemedicine and the availability of in person appointments. The patient expressed understanding and agreed to proceed.  History of Present Illness:    Observations/Objective:   Assessment and Plan:   Follow Up Instructions:    I discussed the assessment and treatment plan with the patient. The patient was provided an opportunity to ask questions and all were answered. The patient agreed with the plan and demonstrated an understanding of the instructions.   The patient was advised to call back or seek an in-person evaluation if the symptoms worsen or if the condition fails to improve as anticipated.  I provided 20 minutes of non-face-to-face time during this encounter.      Review of Systems No headache, no major weight loss or weight gain, no chest pain no back pain abdominal pain no change in bowel habits complete ROS otherwise negative     Objective:   Physical Exam   Virtual     Assessment & Plan:  Impression 1 ADHD.  Overall good control.  Patient would like to use just a long-acting agent in the morning and stop the short acting in the afternoon.  We will do this.  3 months worth given.  2.  Smoking cessation discussed

## 2020-05-16 ENCOUNTER — Ambulatory Visit: Payer: BC Managed Care – PPO | Admitting: Family Medicine

## 2020-05-26 ENCOUNTER — Ambulatory Visit: Payer: BC Managed Care – PPO | Admitting: Family Medicine

## 2020-06-20 ENCOUNTER — Ambulatory Visit (INDEPENDENT_AMBULATORY_CARE_PROVIDER_SITE_OTHER): Payer: BC Managed Care – PPO | Admitting: Family Medicine

## 2020-06-20 ENCOUNTER — Other Ambulatory Visit: Payer: Self-pay

## 2020-06-20 ENCOUNTER — Encounter: Payer: Self-pay | Admitting: Family Medicine

## 2020-06-20 VITALS — BP 124/80 | HR 97 | Temp 96.0°F | Ht 69.0 in | Wt 240.0 lb

## 2020-06-20 DIAGNOSIS — F909 Attention-deficit hyperactivity disorder, unspecified type: Secondary | ICD-10-CM | POA: Diagnosis not present

## 2020-06-20 MED ORDER — AMPHETAMINE-DEXTROAMPHET ER 30 MG PO CP24: 30.0000 mg | ORAL_CAPSULE | ORAL | 0 refills | Status: DC

## 2020-06-20 NOTE — Progress Notes (Signed)
Patient ID: Ronald Mccarty, male    DOB: 12-19-1980, 39 y.o.   MRN: [id number]   Chief Complaint  Patient presents with  . ADHD   Subjective:    HPI Patient was seen today for ADD checkup.  This patient does have ADD.  Patient takes medications for this.  If this does help control overall symptoms.  Please see below. -weight, vital signs reviewed.  The following items were covered. -Compliance with medication : adderall 30 mg once a day   -work concerns-  pt works full time; in control of data   - Eating patterns : eats well   -sleeping: sleeping well   -Additional issues or questions: none  Taking the adderall 30mg  xr and doing well 9am taking it. Used to take 10mg  extra dose and not having to now bc it was affecting sleep.   Medical History Ronald Mccarty has a past medical history of ADHD, Anxiety, GERD (gastroesophageal reflux disease), Hemorrhoids, Hemorrhoids, and Tobacco use.   Outpatient Encounter Medications as of 06/20/2020  Medication Sig  . amphetamine-dextroamphetamine (ADDERALL XR) 30 MG 24 hr capsule Take 1 capsule (30 mg total) by mouth every morning.  Marland Kitchen amphetamine-dextroamphetamine (ADDERALL XR) 30 MG 24 hr capsule Take 1 capsule (30 mg total) by mouth every morning.  Marland Kitchen amphetamine-dextroamphetamine (ADDERALL XR) 30 MG 24 hr capsule Take 1 capsule (30 mg total) by mouth every morning.  . pantoprazole (PROTONIX) 40 MG tablet TAKE 1 TABLET BY MOUTH EVERY DAY  . polyethylene glycol (MIRALAX / GLYCOLAX) packet Take 17 g by mouth daily. May take once daily or twice daily for constipation  . [DISCONTINUED] amphetamine-dextroamphetamine (ADDERALL XR) 30 MG 24 hr capsule Take 1 capsule (30 mg total) by mouth every morning.  . [DISCONTINUED] amphetamine-dextroamphetamine (ADDERALL XR) 30 MG 24 hr capsule Take 1 capsule (30 mg total) by mouth every morning.  . [DISCONTINUED] amphetamine-dextroamphetamine (ADDERALL XR) 30 MG 24 hr capsule Take 1 capsule (30 mg total) by  mouth every morning.  . [DISCONTINUED] amphetamine-dextroamphetamine (ADDERALL) 10 MG tablet Take one tablet po daily  . [DISCONTINUED] varenicline (CHANTIX STARTING MONTH PAK) 0.5 MG X 11 & 1 MG X 42 tablet Take one 0.5 mg tablet by mouth once daily for 3 days, then increase to one 0.5 mg tablet twice daily for 4 days, then increase to one 1 mg tablet twice daily.   No facility-administered encounter medications on file as of 06/20/2020.     Review of Systems  Constitutional: Negative for activity change, appetite change, chills, fever and unexpected weight change.  HENT: Negative for congestion, rhinorrhea and sore throat.   Respiratory: Negative for cough, shortness of breath and wheezing.   Cardiovascular: Negative for chest pain and leg swelling.  Gastrointestinal: Negative for abdominal pain, diarrhea, nausea and vomiting.  Genitourinary: Negative for dysuria and frequency.  Skin: Negative for rash.  Neurological: Negative for dizziness, weakness and headaches.  Psychiatric/Behavioral: Positive for decreased concentration (controlled with meds).     Vitals BP 124/80   Pulse 97   Temp (!) 96 F (35.6 C)   Ht 5\' 9"  (1.753 m)   Wt 240 lb (108.9 kg)   BMI 35.44 kg/m   Objective:   Physical Exam Vitals reviewed.  Constitutional:      General: He is not in acute distress.    Appearance: Normal appearance.  HENT:     Head: Normocephalic.     Nose: No congestion.  Eyes:     Extraocular Movements: Extraocular  movements intact.     Conjunctiva/sclera: Conjunctivae normal.     Pupils: Pupils are equal, round, and reactive to light.  Cardiovascular:     Rate and Rhythm: Normal rate and regular rhythm.     Pulses: Normal pulses.     Heart sounds: Normal heart sounds. No murmur heard.   Pulmonary:     Effort: Pulmonary effort is normal.     Breath sounds: Normal breath sounds. No wheezing, rhonchi or rales.  Musculoskeletal:        General: Normal range of motion.      Right lower leg: No edema.     Left lower leg: No edema.  Skin:    General: Skin is warm and dry.     Findings: No rash.  Neurological:     General: No focal deficit present.     Mental Status: He is alert and oriented to person, place, and time.     Cranial Nerves: No cranial nerve deficit.  Psychiatric:        Mood and Affect: Mood normal.        Behavior: Behavior normal.      Assessment and Plan   1. Adult ADHD - amphetamine-dextroamphetamine (ADDERALL XR) 30 MG 24 hr capsule; Take 1 capsule (30 mg total) by mouth every morning.  Dispense: 30 capsule; Refill: 0 - amphetamine-dextroamphetamine (ADDERALL XR) 30 MG 24 hr capsule; Take 1 capsule (30 mg total) by mouth every morning.  Dispense: 30 capsule; Refill: 0 - amphetamine-dextroamphetamine (ADDERALL XR) 30 MG 24 hr capsule; Take 1 capsule (30 mg total) by mouth every morning.  Dispense: 30 capsule; Refill: 0   ADHD- doing well. Stable.  Cont adderall xr 30mg .  gerd- Protonix, has refills, but not needing currently has some at home. Reflux has improved.   Pt stopped smoking and rec inc in weight 30 lbs. Not been eating as well. Quit 2.33mo ago. Tried chantix and then went cold [place].  Last dose picked up for adderall- 05/06/20.  F/u 44mo or prn.

## 2020-06-20 NOTE — Patient Instructions (Signed)

## 2020-09-20 ENCOUNTER — Ambulatory Visit (INDEPENDENT_AMBULATORY_CARE_PROVIDER_SITE_OTHER): Payer: BC Managed Care – PPO | Admitting: Family Medicine

## 2020-09-20 ENCOUNTER — Other Ambulatory Visit: Payer: Self-pay

## 2020-09-20 ENCOUNTER — Encounter: Payer: Self-pay | Admitting: Family Medicine

## 2020-09-20 VITALS — BP 124/82 | HR 98 | Temp 97.4°F | Ht 69.0 in | Wt 239.0 lb

## 2020-09-20 DIAGNOSIS — F909 Attention-deficit hyperactivity disorder, unspecified type: Secondary | ICD-10-CM

## 2020-09-20 DIAGNOSIS — K219 Gastro-esophageal reflux disease without esophagitis: Secondary | ICD-10-CM | POA: Diagnosis not present

## 2020-09-20 DIAGNOSIS — H66001 Acute suppurative otitis media without spontaneous rupture of ear drum, right ear: Secondary | ICD-10-CM

## 2020-09-20 MED ORDER — AMPHETAMINE-DEXTROAMPHET ER 30 MG PO CP24: 30.0000 mg | ORAL_CAPSULE | ORAL | 0 refills | Status: DC

## 2020-09-20 MED ORDER — AMOXICILLIN 500 MG PO CAPS: 500.0000 mg | ORAL_CAPSULE | Freq: Three times a day (TID) | ORAL | 0 refills | Status: DC

## 2020-09-20 NOTE — Progress Notes (Signed)
Patient ID: Ronald Mccarty, male    DOB: December 05, 1980, 39 y.o.   MRN: [id number]   Chief Complaint  Patient presents with  . ADD   Subjective:    HPI  F/u- ADD check up. Takes adderall xr 30mg  for ADD.   Received request for refill of ADHD Medication.   Last refill date of Pyschostimulants: Adderall XR 30mg  Medication: # dispensed: 30 With 2 other refills.   Does Osmel seem to have any problems with moodiness, appetite, weight loss, or sleep? no Any complaints by Ronald Mccarty about taking the medications? yes When was he last examined for ADHD? 8/21 Who is he seeing for his ADHD symptoms? Ronald Mccarty, pcp When is his next appointment due? 3 months   Takes protonix 40mg  as needed for reflux. Not needing refill at this time.  Right ear pain off and on for one month.  Having some pain on rt. Open and close jaw feeling ear discomfort. 3-4 wks.  Son with ear infection. No sinus concerns or runny nose.   Taking medication for add.  No issues with sleep.   Diet- doing ok.  Pt stating needing to watch his diet better. No chest pain, ha, or palpitations.  gerd- protonix as needed. Not on it daily. Hasn't had it for past month.  Has some still.  Taking miralax daily from having surgery from hemorrhoids then will have issues, so staying on it.   Quit smoking and just did on own w/o medications.   Medical History Ronald Mccarty has a past medical history of ADHD, Anxiety, GERD (gastroesophageal reflux disease), Hemorrhoids, Hemorrhoids, and Tobacco use.   Outpatient Encounter Medications as of 09/20/2020  Medication Sig  . amphetamine-dextroamphetamine (ADDERALL XR) 30 MG 24 hr capsule Take 1 capsule (30 mg total) by mouth every morning.  Marland Kitchen amphetamine-dextroamphetamine (ADDERALL XR) 30 MG 24 hr capsule Take 1 capsule (30 mg total) by mouth every morning.  Marland Kitchen amphetamine-dextroamphetamine (ADDERALL XR) 30 MG 24 hr capsule Take 1 capsule (30 mg total) by mouth every morning.  . pantoprazole  (PROTONIX) 40 MG tablet TAKE 1 TABLET BY MOUTH EVERY DAY  . polyethylene glycol (MIRALAX / GLYCOLAX) packet Take 17 g by mouth daily. May take once daily or twice daily for constipation  . [DISCONTINUED] amphetamine-dextroamphetamine (ADDERALL XR) 30 MG 24 hr capsule Take 1 capsule (30 mg total) by mouth every morning.  . [DISCONTINUED] amphetamine-dextroamphetamine (ADDERALL XR) 30 MG 24 hr capsule Take 1 capsule (30 mg total) by mouth every morning.  . [DISCONTINUED] amphetamine-dextroamphetamine (ADDERALL XR) 30 MG 24 hr capsule Take 1 capsule (30 mg total) by mouth every morning.  Marland Kitchen amoxicillin (AMOXIL) 500 MG capsule Take 1 capsule (500 mg total) by mouth 3 (three) times daily.   No facility-administered encounter medications on file as of 09/20/2020.     Review of Systems  Constitutional: Negative for chills and fever.  HENT: Positive for ear pain (right). Negative for congestion, rhinorrhea and sore throat.   Respiratory: Negative for cough, shortness of breath and wheezing.   Cardiovascular: Negative for chest pain and leg swelling.  Gastrointestinal: Negative for abdominal pain, diarrhea, nausea and vomiting.  Genitourinary: Negative for dysuria and frequency.  Skin: Negative for rash.  Neurological: Negative for dizziness, weakness and headaches.  Psychiatric/Behavioral: Negative for agitation, behavioral problems, confusion, decreased concentration, dysphoric mood, sleep disturbance and suicidal ideas. The patient is not nervous/anxious.      Vitals BP 124/82   Pulse 98   Temp (!) 97.4 F (36.3  C)   Ht 5\' 9"  (1.753 m)   Wt 239 lb (108.4 kg)   SpO2 98%   BMI 35.29 kg/m   Objective:   Physical Exam Vitals and nursing note reviewed.  Constitutional:      General: He is not in acute distress.    Appearance: Normal appearance. He is not ill-appearing.  HENT:     Head: Normocephalic.     Right Ear: External ear normal.     Left Ear: Tympanic membrane, ear canal and  external ear normal.     Ears:     Comments: Rt tm with injection and purulent fluid. Erythema in EAC on right. Normal tm on left.    Nose: Nose normal. No congestion or rhinorrhea.     Mouth/Throat:     Mouth: Mucous membranes are moist.     Pharynx: No oropharyngeal exudate or posterior oropharyngeal erythema.  Eyes:     Extraocular Movements: Extraocular movements intact.     Conjunctiva/sclera: Conjunctivae normal.     Pupils: Pupils are equal, round, and reactive to light.  Cardiovascular:     Rate and Rhythm: Normal rate and regular rhythm.     Pulses: Normal pulses.     Heart sounds: Normal heart sounds. No murmur heard.   Pulmonary:     Effort: Pulmonary effort is normal.     Breath sounds: Normal breath sounds. No wheezing, rhonchi or rales.  Musculoskeletal:        General: Normal range of motion.     Right lower leg: No edema.     Left lower leg: No edema.  Skin:    General: Skin is warm and dry.     Findings: No rash.  Neurological:     General: No focal deficit present.     Mental Status: He is alert and oriented to person, place, and time.     Cranial Nerves: No cranial nerve deficit.  Psychiatric:        Mood and Affect: Mood normal.        Behavior: Behavior normal.        Thought Content: Thought content normal.        Judgment: Judgment normal.      Assessment and Plan   1. Adult ADHD - amphetamine-dextroamphetamine (ADDERALL XR) 30 MG 24 hr capsule; Take 1 capsule (30 mg total) by mouth every morning.  Dispense: 30 capsule; Refill: 0 - amphetamine-dextroamphetamine (ADDERALL XR) 30 MG 24 hr capsule; Take 1 capsule (30 mg total) by mouth every morning.  Dispense: 30 capsule; Refill: 0 - amphetamine-dextroamphetamine (ADDERALL XR) 30 MG 24 hr capsule; Take 1 capsule (30 mg total) by mouth every morning.  Dispense: 30 capsule; Refill: 0  2. Gastroesophageal reflux disease, unspecified whether esophagitis present  3. Non-recurrent acute suppurative  otitis media of right ear without spontaneous rupture of tympanic membrane - amoxicillin (AMOXIL) 500 MG capsule; Take 1 capsule (500 mg total) by mouth 3 (three) times daily.  Dispense: 30 capsule; Refill: 0   Hm- utd flu vaccine on 08/28/20. Had covid vaccine 2x- [employer]fizer.  ADD- Last refill on adderall 30mg  xr, was 08/16/20. Pt doing well with medications, no side effects and helping with his work.  Will cont with medications.    gerd- using protonix prn.  Not needing refills.   OM- right- started on amoxicillin.  Call or rto if not improving.  F/u 3 mo or prn.

## 2020-10-19 ENCOUNTER — Other Ambulatory Visit: Payer: Self-pay

## 2020-10-19 ENCOUNTER — Ambulatory Visit (INDEPENDENT_AMBULATORY_CARE_PROVIDER_SITE_OTHER): Payer: BC Managed Care – PPO | Admitting: Family Medicine

## 2020-10-19 ENCOUNTER — Encounter: Payer: Self-pay | Admitting: Family Medicine

## 2020-10-19 VITALS — BP 128/80 | HR 97 | Temp 97.3°F | Ht 69.0 in | Wt 244.0 lb

## 2020-10-19 DIAGNOSIS — H6501 Acute serous otitis media, right ear: Secondary | ICD-10-CM

## 2020-10-19 MED ORDER — FLUTICASONE PROPIONATE 50 MCG/ACT NA SUSP: 2.0000 | Freq: Every day | NASAL | 1 refills | Status: DC

## 2020-10-19 MED ORDER — PREDNISONE 10 MG PO TABS: ORAL_TABLET | ORAL | 0 refills | Status: DC

## 2020-10-19 NOTE — Progress Notes (Signed)
Patient ID: Ronald Mccarty, male    DOB: 03/21/1981, 38 y.o.   MRN: [id number]   Chief Complaint  Patient presents with  . Otalgia   Subjective:    HPI  CC- right ear and jaw pain.   Was taking amoxil from urgent care and felt like it got better but about one week ago started having ear pain again.  Not having dec hearing, sore throat, coughing, or fever. Feels that the pain in right ear radiates down the jaw.  Had dentist look at teeth and were normal.   Medical History Ronald Mccarty has a past medical history of ADHD, Anxiety, GERD (gastroesophageal reflux disease), Hemorrhoids, Hemorrhoids, and Tobacco use.   Outpatient Encounter Medications as of 10/19/2020  Medication Sig  . amphetamine-dextroamphetamine (ADDERALL XR) 30 MG 24 hr capsule Take 1 capsule (30 mg total) by mouth every morning.  Marland Kitchen amphetamine-dextroamphetamine (ADDERALL XR) 30 MG 24 hr capsule Take 1 capsule (30 mg total) by mouth every morning.  Marland Kitchen amphetamine-dextroamphetamine (ADDERALL XR) 30 MG 24 hr capsule Take 1 capsule (30 mg total) by mouth every morning.  . pantoprazole (PROTONIX) 40 MG tablet TAKE 1 TABLET BY MOUTH EVERY DAY  . polyethylene glycol (MIRALAX / GLYCOLAX) packet Take 17 g by mouth daily. May take once daily or twice daily for constipation  . [DISCONTINUED] amoxicillin (AMOXIL) 500 MG capsule Take 1 capsule (500 mg total) by mouth 3 (three) times daily.  . fluticasone (FLONASE) 50 MCG/ACT nasal spray Place 2 sprays into both nostrils daily.  . predniSONE (DELTASONE) 10 MG tablet Take 3 tab p.o. x2 days, then 2 tab x 2 days, then 1 tab x 2 days.   No facility-administered encounter medications on file as of 10/19/2020.     Review of Systems  Constitutional: Negative for chills and fever.  HENT: Positive for ear pain. Negative for congestion, rhinorrhea and sore throat.        +rt jaw pain  Respiratory: Negative for cough, shortness of breath and wheezing.   Cardiovascular: Negative for chest  pain and leg swelling.  Gastrointestinal: Negative for abdominal pain, diarrhea, nausea and vomiting.  Genitourinary: Negative for dysuria and frequency.  Skin: Negative for rash.  Neurological: Negative for dizziness, weakness and headaches.     Vitals BP 128/80   Pulse 97   Temp (!) 97.3 F (36.3 C)   Ht 5\' 9"  (1.753 m)   Wt 244 lb (110.7 kg)   SpO2 99%   BMI 36.03 kg/m   Objective:   Physical Exam Vitals and nursing note reviewed.  Constitutional:      General: He is not in acute distress.    Appearance: Normal appearance. He is not ill-appearing.  HENT:     Head: Normocephalic and atraumatic.     Right Ear: Ear canal and external ear normal.     Left Ear: Ear canal and external ear normal.     Nose: Nose normal. No congestion or rhinorrhea.     Mouth/Throat:     Mouth: Mucous membranes are moist.     Pharynx: No oropharyngeal exudate or posterior oropharyngeal erythema.  Eyes:     Extraocular Movements: Extraocular movements intact.     Conjunctiva/sclera: Conjunctivae normal.     Pupils: Pupils are equal, round, and reactive to light.  Pulmonary:     Effort: Pulmonary effort is normal. No respiratory distress.  Musculoskeletal:     Cervical back: Normal range of motion.  Skin:    General: Skin is  warm and dry.     Findings: No rash.  Neurological:     Mental Status: He is alert.      Assessment and Plan   1. Non-recurrent acute serous otitis media of right ear - predniSONE (DELTASONE) 10 MG tablet; Take 3 tab p.o. x2 days, then 2 tab x 2 days, then 1 tab x 2 days.  Dispense: 12 tablet; Refill: 0 - fluticasone (FLONASE) 50 MCG/ACT nasal spray; Place 2 sprays into both nostrils daily.  Dispense: 16 g; Refill: 1   Pt treated for otitis media with some improvement with amoxicillin, but feeling ear isn't completely better and having some pain. Has OM -serous effusion.  Use Flonase, sudafed for 10-14 days. Small course of Prednisone.  If not better in 2  wks call or rto, may need referral to ENT.  F/u prn.

## 2020-10-19 NOTE — Patient Instructions (Signed)
Eustachian Tube Dysfunction ° °Eustachian tube dysfunction refers to a condition in which a blockage develops in the narrow passage that connects the middle ear to the back of the nose (eustachian tube). The eustachian tube regulates air pressure in the middle ear by letting air move between the ear and nose. It also helps to drain fluid from the middle ear space. °Eustachian tube dysfunction can affect one or both ears. When the eustachian tube does not function properly, air pressure, fluid, or both can build up in the middle ear. °What are the causes? °This condition occurs when the eustachian tube becomes blocked or cannot open normally. Common causes of this condition include: °· Ear infections. °· Colds and other infections that affect the nose, mouth, and throat (upper respiratory tract). °· Allergies. °· Irritation from cigarette smoke. °· Irritation from stomach acid coming up into the esophagus (gastroesophageal reflux). The esophagus is the tube that carries food from the mouth to the stomach. °· Sudden changes in air pressure, such as from descending in an airplane or scuba diving. °· Abnormal growths in the nose or throat, such as: °? Growths that line the nose (nasal polyps). °? Abnormal growth of cells (tumors). °? Enlarged tissue at the back of the throat (adenoids). °What increases the risk? °You are more likely to develop this condition if: °· You smoke. °· You are overweight. °· You are a child who has: °? Certain birth defects of the mouth, such as cleft palate. °? Large tonsils or adenoids. °What are the signs or symptoms? °Common symptoms of this condition include: °· A feeling of fullness in the ear. °· Ear pain. °· Clicking or popping noises in the ear. °· Ringing in the ear. °· Hearing loss. °· Loss of balance. °· Dizziness. °Symptoms may get worse when the air pressure around you changes, such as when you travel to an area of high elevation, fly on an airplane, or go scuba diving. °How is  this diagnosed? °This condition may be diagnosed based on: °· Your symptoms. °· A physical exam of your ears, nose, and throat. °· Tests, such as those that measure: °? The movement of your eardrum (tympanogram). °? Your hearing (audiometry). °How is this treated? °Treatment depends on the cause and severity of your condition. °· In mild cases, you may relieve your symptoms by moving air into your ears. This is called "popping the ears." °· In more severe cases, or if you have symptoms of fluid in your ears, treatment may include: °? Medicines to relieve congestion (decongestants). °? Medicines that treat allergies (antihistamines). °? Nasal sprays or ear drops that contain medicines that reduce swelling (steroids). °? A procedure to drain the fluid in your eardrum (myringotomy). In this procedure, a small tube is placed in the eardrum to: °§ Drain the fluid. °§ Restore the air in the middle ear space. °? A procedure to insert a balloon device through the nose to inflate the opening of the eustachian tube (balloon dilation). °Follow these instructions at home: °Lifestyle °· Do not do any of the following until your health care provider approves: °? Travel to high altitudes. °? Fly in airplanes. °? Work in a pressurized cabin or room. °? Scuba dive. °· Do not use any products that contain nicotine or tobacco, such as cigarettes and e-cigarettes. If you need help quitting, ask your health care provider. °· Keep your ears dry. Wear fitted earplugs during showering and bathing. Dry your ears completely after. °General instructions °· Take over-the-counter   and prescription medicines only as told by your health care provider. °· Use techniques to help pop your ears as recommended by your health care provider. These may include: °? Chewing gum. °? Yawning. °? Frequent, forceful swallowing. °? Closing your mouth, holding your nose closed, and gently blowing as if you are trying to blow air out of your nose. °· Keep all  follow-up visits as told by your health care provider. This is important. °Contact a health care provider if: °· Your symptoms do not go away after treatment. °· Your symptoms come back after treatment. °· You are unable to pop your ears. °· You have: °? A fever. °? Pain in your ear. °? Pain in your head or neck. °? Fluid draining from your ear. °· Your hearing suddenly changes. °· You become very dizzy. °· You lose your balance. °Summary °· Eustachian tube dysfunction refers to a condition in which a blockage develops in the eustachian tube. °· It can be caused by ear infections, allergies, inhaled irritants, or abnormal growths in the nose or throat. °· Symptoms include ear pain, hearing loss, or ringing in the ears. °· Mild cases are treated with maneuvers to unblock the ears, such as yawning or ear popping. °· Severe cases are treated with medicines. Surgery may also be done (rare). °This information is not intended to replace advice given to you by your health care provider. Make sure you discuss any questions you have with your health care provider. °Document Revised: 02/03/2018 Document Reviewed: 02/03/2018 °[employer] Patient Education © 2020 [employer] ° °

## 2020-11-10 ENCOUNTER — Other Ambulatory Visit: Payer: Self-pay | Admitting: Family Medicine

## 2020-11-10 DIAGNOSIS — H6501 Acute serous otitis media, right ear: Secondary | ICD-10-CM

## 2020-12-08 ENCOUNTER — Other Ambulatory Visit: Payer: Self-pay | Admitting: Family Medicine

## 2020-12-08 DIAGNOSIS — H6501 Acute serous otitis media, right ear: Secondary | ICD-10-CM

## 2020-12-21 ENCOUNTER — Ambulatory Visit (INDEPENDENT_AMBULATORY_CARE_PROVIDER_SITE_OTHER): Payer: BC Managed Care – PPO | Admitting: Family Medicine

## 2020-12-21 ENCOUNTER — Encounter: Payer: Self-pay | Admitting: Family Medicine

## 2020-12-21 ENCOUNTER — Other Ambulatory Visit: Payer: Self-pay

## 2020-12-21 VITALS — BP 130/86 | HR 85 | Temp 98.1°F | Ht 69.0 in | Wt 243.0 lb

## 2020-12-21 DIAGNOSIS — F909 Attention-deficit hyperactivity disorder, unspecified type: Secondary | ICD-10-CM | POA: Diagnosis not present

## 2020-12-21 DIAGNOSIS — Z0000 Encounter for general adult medical examination without abnormal findings: Secondary | ICD-10-CM | POA: Diagnosis not present

## 2020-12-21 MED ORDER — AMPHETAMINE-DEXTROAMPHET ER 30 MG PO CP24: 30.0000 mg | ORAL_CAPSULE | ORAL | 0 refills | Status: DC

## 2020-12-21 MED ORDER — PANTOPRAZOLE SODIUM 40 MG PO TBEC: DELAYED_RELEASE_TABLET | ORAL | 3 refills | Status: DC

## 2020-12-21 MED ORDER — AMPHETAMINE-DEXTROAMPHET ER 30 MG PO CP24
30.0000 mg | ORAL_CAPSULE | ORAL | 0 refills | Status: DC
Start: 2020-12-21 — End: 2021-03-20

## 2020-12-21 NOTE — Progress Notes (Signed)
Patient ID: Ronald Mccarty, male    DOB: 09-Sep-1981, 40 y.o.   MRN: [id number]   Chief Complaint  Patient presents with  . Annual Exam   Subjective:    HPI The patient comes in today for a wellness visit.  A review of their health history was completed.  A review of medications was also completed.  Any needed refills;adderall, protonix  Eating habits: not health conscious ( states he needs to focus on this)   Falls/  MVA accidents in past few months: none  Regular exercise: none   Specialist pt sees on regular basis: none, currently.  Used to work out daily.  Has new infant son and working over 50hrs per week currently.  Preventative health issues were discussed.   Additional concerns: none  Had covid 2 months ago. Whole family.  Feeling like lingering for a while.  Getting covid booster today and had vaccines 2x.    Pt seen to f/u with ADHD. Taking adderall 31m XR. No side effects. No insomnia, palpitations, HA, or weight loss.  History of low Hb and anemia due to bleeding hemorrhoid. miralax 1  X per day to help with constipation.  Colonoscopy 2x. Had some ulcerations, and dx with hemorrhoids.   Medical History Ronald Mccarty a past medical history of ADHD, Anxiety, GERD (gastroesophageal reflux disease), Hemorrhoids, Hemorrhoids, and Tobacco use.   Outpatient Encounter Medications as of 12/21/2020  Medication Sig  . amphetamine-dextroamphetamine (ADDERALL XR) 30 MG 24 hr capsule Take 1 capsule (30 mg total) by mouth every morning.  .Marland Kitchenamphetamine-dextroamphetamine (ADDERALL XR) 30 MG 24 hr capsule Take 1 capsule (30 mg total) by mouth every morning.  .Marland Kitchenamphetamine-dextroamphetamine (ADDERALL XR) 30 MG 24 hr capsule Take 1 capsule (30 mg total) by mouth every morning.  . fluticasone (FLONASE) 50 MCG/ACT nasal spray SPRAY 2 SPRAYS INTO EACH NOSTRIL EVERY DAY  . pantoprazole (PROTONIX) 40 MG tablet TAKE 1 TABLET BY MOUTH EVERY DAY  . polyethylene glycol (MIRALAX /  GLYCOLAX) packet Take 17 g by mouth daily. May take once daily or twice daily for constipation  . [DISCONTINUED] amphetamine-dextroamphetamine (ADDERALL XR) 30 MG 24 hr capsule Take 1 capsule (30 mg total) by mouth every morning.  . [DISCONTINUED] amphetamine-dextroamphetamine (ADDERALL XR) 30 MG 24 hr capsule Take 1 capsule (30 mg total) by mouth every morning.  . [DISCONTINUED] amphetamine-dextroamphetamine (ADDERALL XR) 30 MG 24 hr capsule Take 1 capsule (30 mg total) by mouth every morning.  . [DISCONTINUED] pantoprazole (PROTONIX) 40 MG tablet TAKE 1 TABLET BY MOUTH EVERY DAY  . [DISCONTINUED] predniSONE (DELTASONE) 10 MG tablet Take 3 tab p.o. x2 days, then 2 tab x 2 days, then 1 tab x 2 days.   No facility-administered encounter medications on file as of 12/21/2020.     Review of Systems  Constitutional: Negative for chills and fever.  HENT: Negative for congestion, rhinorrhea and sore throat.   Respiratory: Negative for cough, shortness of breath and wheezing.   Cardiovascular: Negative for chest pain and leg swelling.  Gastrointestinal: Negative for abdominal pain, diarrhea, nausea and vomiting.  Genitourinary: Negative for dysuria and frequency.  Skin: Negative for rash.  Neurological: Negative for dizziness, weakness and headaches.     Vitals BP 130/86   Pulse 85   Temp 98.1 F (36.7 C)   Ht '5\' 9"'  (1.753 m)   Wt 243 lb (110.2 kg)   SpO2 99%   BMI 35.88 kg/m   Objective:   Physical Exam Vitals and  nursing note reviewed.  Constitutional:      General: He is not in acute distress.    Appearance: Normal appearance. He is not ill-appearing.  HENT:     Head: Normocephalic.     Right Ear: External ear normal.     Left Ear: External ear normal.     Nose: Nose normal. No congestion or rhinorrhea.     Mouth/Throat:     Mouth: Mucous membranes are moist.     Pharynx: No oropharyngeal exudate or posterior oropharyngeal erythema.  Eyes:     Extraocular Movements:  Extraocular movements intact.     Conjunctiva/sclera: Conjunctivae normal.     Pupils: Pupils are equal, round, and reactive to light.  Cardiovascular:     Rate and Rhythm: Normal rate and regular rhythm.     Pulses: Normal pulses.     Heart sounds: Normal heart sounds. No murmur heard.   Pulmonary:     Effort: Pulmonary effort is normal. No respiratory distress.     Breath sounds: Normal breath sounds. No wheezing, rhonchi or rales.  Abdominal:     General: Abdomen is flat. Bowel sounds are normal. There is no distension.     Palpations: Abdomen is soft. There is no mass.     Tenderness: There is no abdominal tenderness. There is no guarding or rebound.     Hernia: No hernia is present.  Musculoskeletal:        General: Normal range of motion.     Cervical back: Normal range of motion.     Right lower leg: No edema.     Left lower leg: No edema.  Skin:    General: Skin is warm and dry.     Findings: No rash.  Neurological:     General: No focal deficit present.     Mental Status: He is alert and oriented to person, place, and time.     Cranial Nerves: No cranial nerve deficit.     Motor: No weakness.     Gait: Gait normal.  Psychiatric:        Mood and Affect: Mood normal.        Behavior: Behavior normal.        Thought Content: Thought content normal.        Judgment: Judgment normal.      Assessment and Plan   1. Routine general medical examination at a health care facility - CBC - CMP14+EGFR - Hemoglobin A1c - Lipid panel  2. Adult ADHD - amphetamine-dextroamphetamine (ADDERALL XR) 30 MG 24 hr capsule; Take 1 capsule (30 mg total) by mouth every morning.  Dispense: 30 capsule; Refill: 0 - amphetamine-dextroamphetamine (ADDERALL XR) 30 MG 24 hr capsule; Take 1 capsule (30 mg total) by mouth every morning.  Dispense: 30 capsule; Refill: 0 - amphetamine-dextroamphetamine (ADDERALL XR) 30 MG 24 hr capsule; Take 1 capsule (30 mg total) by mouth every morning.   Dispense: 30 capsule; Refill: 0   ADD- pt doing well on medications.  Symptoms controlled.  No side effects.  Will cont.  Last refill 11/16/20 for Adderall.  pmp reviewed.  cpe- doing well. Normal exam. Pt getting covid booster today.  F/u 19mofor adhd.

## 2021-01-12 ENCOUNTER — Other Ambulatory Visit: Payer: Self-pay | Admitting: Family Medicine

## 2021-01-12 DIAGNOSIS — H6501 Acute serous otitis media, right ear: Secondary | ICD-10-CM

## 2021-02-08 ENCOUNTER — Other Ambulatory Visit: Payer: Self-pay | Admitting: Family Medicine

## 2021-02-08 DIAGNOSIS — H6501 Acute serous otitis media, right ear: Secondary | ICD-10-CM

## 2021-03-10 ENCOUNTER — Other Ambulatory Visit: Payer: Self-pay | Admitting: Family Medicine

## 2021-03-10 DIAGNOSIS — H6501 Acute serous otitis media, right ear: Secondary | ICD-10-CM

## 2021-03-20 ENCOUNTER — Ambulatory Visit (INDEPENDENT_AMBULATORY_CARE_PROVIDER_SITE_OTHER): Payer: BC Managed Care – PPO | Admitting: Family Medicine

## 2021-03-20 ENCOUNTER — Ambulatory Visit: Payer: BC Managed Care – PPO | Admitting: Family Medicine

## 2021-03-20 ENCOUNTER — Encounter: Payer: Self-pay | Admitting: Family Medicine

## 2021-03-20 ENCOUNTER — Other Ambulatory Visit: Payer: Self-pay

## 2021-03-20 VITALS — BP 120/82 | HR 88 | Temp 97.3°F | Ht 69.0 in | Wt 245.0 lb

## 2021-03-20 DIAGNOSIS — R3911 Hesitancy of micturition: Secondary | ICD-10-CM | POA: Diagnosis not present

## 2021-03-20 DIAGNOSIS — R319 Hematuria, unspecified: Secondary | ICD-10-CM | POA: Diagnosis not present

## 2021-03-20 DIAGNOSIS — F909 Attention-deficit hyperactivity disorder, unspecified type: Secondary | ICD-10-CM | POA: Diagnosis not present

## 2021-03-20 LAB — POCT URINALYSIS DIP (MANUAL ENTRY)
Bilirubin, UA: NEGATIVE
Glucose, UA: NEGATIVE mg/dL
Ketones, POC UA: NEGATIVE mg/dL
Leukocytes, UA: NEGATIVE
Nitrite, UA: NEGATIVE
Spec Grav, UA: >=1.03 — AB (ref 1.010–1.025)
Urobilinogen, UA: 0.2 E.U./dL
pH, UA: 6 (ref 5.0–8.0)

## 2021-03-20 MED ORDER — AMPHETAMINE-DEXTROAMPHET ER 30 MG PO CP24: 30.0000 mg | ORAL_CAPSULE | ORAL | 0 refills | Status: DC

## 2021-03-20 NOTE — Progress Notes (Signed)
Patient ID: Ronald Mccarty, male    DOB: 10-Jun-1981, 40 y.o.   MRN: [id number]   Chief Complaint  Patient presents with   follow up ADD   Subjective:    HPI  This patient has adult ADD. Takes medication responsibly. Medication does help the patient focus in be more functional. Patient relates that they are or not abusing the medication or misusing the medication. The patient understands that if they're having any negative side effects such as elevated high blood pressure severe headaches they would need stop the medication follow-up immediately. They also understand that the prescriptions are to last for 3 months then the patient will need to follow-up before having further prescriptions.  Patient compliance yes  Does medication help patient function /attention better  yes  Side effects  None  Had labs today and not resulted yet.   Pt eating more in evenings. Not smoking. Quit for past year.   Last 2 days having some hesitation, burning.   Cramping feeling in lower abdomen. Darker urine.  No blood.  Not drinking much water.  Drinking lots of caffeine.  No diarrhea. No blood in ejaculate.  Never had UTIs in past. flomax in past for urinary issues.    Medical History Duan has a past medical history of ADHD, Anxiety, GERD (gastroesophageal reflux disease), Hemorrhoids, Hemorrhoids, and Tobacco use.   Outpatient Encounter Medications as of 03/20/2021  Medication Sig   amphetamine-dextroamphetamine (ADDERALL XR) 30 MG 24 hr capsule Take 1 capsule (30 mg total) by mouth every morning.   amphetamine-dextroamphetamine (ADDERALL XR) 30 MG 24 hr capsule Take 1 capsule (30 mg total) by mouth every morning.   amphetamine-dextroamphetamine (ADDERALL XR) 30 MG 24 hr capsule Take 1 capsule (30 mg total) by mouth every morning.   fluticasone (FLONASE) 50 MCG/ACT nasal spray SPRAY 2 SPRAYS INTO EACH NOSTRIL EVERY DAY   pantoprazole (PROTONIX) 40 MG tablet TAKE 1 TABLET BY MOUTH  EVERY DAY   polyethylene glycol (MIRALAX / GLYCOLAX) packet Take 17 g by mouth daily. May take once daily or twice daily for constipation   [DISCONTINUED] amphetamine-dextroamphetamine (ADDERALL XR) 30 MG 24 hr capsule Take 1 capsule (30 mg total) by mouth every morning.   [DISCONTINUED] amphetamine-dextroamphetamine (ADDERALL XR) 30 MG 24 hr capsule Take 1 capsule (30 mg total) by mouth every morning.   [DISCONTINUED] amphetamine-dextroamphetamine (ADDERALL XR) 30 MG 24 hr capsule Take 1 capsule (30 mg total) by mouth every morning.   No facility-administered encounter medications on file as of 03/20/2021.     Review of Systems  Constitutional:  Negative for chills and fever.  HENT:  Negative for congestion, rhinorrhea and sore throat.   Respiratory:  Negative for cough, shortness of breath and wheezing.   Cardiovascular:  Negative for chest pain and leg swelling.  Gastrointestinal:  Positive for abdominal pain (lower abd cramping). Negative for diarrhea, nausea and vomiting.  Genitourinary:  Positive for difficulty urinating. Negative for dysuria, flank pain, frequency, hematuria, penile discharge, penile pain and urgency.  Skin:  Negative for rash.  Neurological:  Negative for dizziness, weakness and headaches.  Psychiatric/Behavioral:  Negative for decreased concentration, dysphoric mood, self-injury, sleep disturbance and suicidal ideas. The patient is not nervous/anxious.     Vitals BP 120/82   Pulse 88   Temp (!) 97.3 F (36.3 C)   Ht 5\' 9"  (1.753 m)   Wt 245 lb (111.1 kg)   SpO2 99%   BMI 36.18 kg/m   Objective:   Physical  Exam Vitals and nursing note reviewed.  Constitutional:      General: He is not in acute distress.    Appearance: Normal appearance. He is not ill-appearing.  HENT:     Head: Normocephalic.  Eyes:     Extraocular Movements: Extraocular movements intact.     Conjunctiva/sclera: Conjunctivae normal.     Pupils: Pupils are equal, round, and reactive  to light.  Cardiovascular:     Rate and Rhythm: Normal rate and regular rhythm.     Pulses: Normal pulses.     Heart sounds: Normal heart sounds. No murmur heard. Pulmonary:     Effort: Pulmonary effort is normal.     Breath sounds: Normal breath sounds. No wheezing, rhonchi or rales.  Abdominal:     General: Abdomen is flat. Bowel sounds are normal. There is no distension.     Palpations: Abdomen is soft. There is no mass.     Tenderness: There is no abdominal tenderness. There is no guarding or rebound.     Hernia: No hernia is present.  Musculoskeletal:        General: Normal range of motion.     Right lower leg: No edema.     Left lower leg: No edema.  Skin:    General: Skin is warm and dry.     Findings: No rash.  Neurological:     General: No focal deficit present.     Mental Status: He is alert and oriented to person, place, and time.     Cranial Nerves: No cranial nerve deficit.  Psychiatric:        Mood and Affect: Mood normal.        Behavior: Behavior normal.        Thought Content: Thought content normal.        Judgment: Judgment normal.     Assessment and Plan   1. Adult ADHD - amphetamine-dextroamphetamine (ADDERALL XR) 30 MG 24 hr capsule; Take 1 capsule (30 mg total) by mouth every morning.  Dispense: 30 capsule; Refill: 0 - amphetamine-dextroamphetamine (ADDERALL XR) 30 MG 24 hr capsule; Take 1 capsule (30 mg total) by mouth every morning.  Dispense: 30 capsule; Refill: 0 - amphetamine-dextroamphetamine (ADDERALL XR) 30 MG 24 hr capsule; Take 1 capsule (30 mg total) by mouth every morning.  Dispense: 30 capsule; Refill: 0  2. Urinary hesitancy - POCT urinalysis dipstick - Urine Culture - GC/Chlamydia Probe Amp  3. Hematuria, unspecified type - Urine Culture - GC/Chlamydia Probe Amp   Reviewed pmp Last fill 02/16/21.  UA- showing trace protein, trace blood,  and slight elevated Spec grav. Neg- wbc, and nitrites.  Will order urine culture and gc/ct.    Pt to increase water intake to 60-80 oz per day, avoid caffeine.  Call or rto if fever, more pain or blood in urine.  Pt in agreement.  Recheck urine on next visit.   Return in about 3 months (around 06/20/2021) for f/u adhd.   04/07/2021

## 2021-03-21 LAB — LIPID PANEL
Chol/HDL Ratio: 3.6 ratio (ref 0.0–5.0)
Cholesterol, Total: 251 mg/dL — ABNORMAL HIGH (ref 100–199)
HDL: 70 mg/dL (ref >39)
LDL Chol Calc (NIH): 166 mg/dL — ABNORMAL HIGH (ref 0–99)
Triglycerides: 90 mg/dL (ref 0–149)
VLDL Cholesterol Cal: 15 mg/dL (ref 5–40)

## 2021-03-21 LAB — CMP14+EGFR
ALT: 21 IU/L (ref 0–44)
AST: 21 IU/L (ref 0–40)
Albumin/Globulin Ratio: 2 (ref 1.2–2.2)
Albumin: 4.8 g/dL (ref 4.0–5.0)
Alkaline Phosphatase: 81 IU/L (ref 44–121)
BUN/Creatinine Ratio: 18 (ref 9–20)
BUN: 17 mg/dL (ref 6–24)
Bilirubin Total: 0.4 mg/dL (ref 0.0–1.2)
CO2: 25 mmol/L (ref 20–29)
Calcium: 9.5 mg/dL (ref 8.7–10.2)
Chloride: 105 mmol/L (ref 96–106)
Creatinine, Ser: 0.97 mg/dL (ref 0.76–1.27)
Globulin, Total: 2.4 g/dL (ref 1.5–4.5)
Glucose: 98 mg/dL (ref 65–99)
Potassium: 5.4 mmol/L — ABNORMAL HIGH (ref 3.5–5.2)
Sodium: 144 mmol/L (ref 134–144)
Total Protein: 7.2 g/dL (ref 6.0–8.5)
eGFR: 101 mL/min/{1.73_m2} (ref >59)

## 2021-03-21 LAB — HEMOGLOBIN A1C
Est. average glucose Bld gHb Est-mCnc: 114 mg/dL
Hgb A1c MFr Bld: 5.6 % (ref 4.8–5.6)

## 2021-03-21 LAB — CBC
Hematocrit: 46.3 % (ref 37.5–51.0)
Hemoglobin: 15.1 g/dL (ref 13.0–17.7)
MCH: 27.5 pg (ref 26.6–33.0)
MCHC: 32.6 g/dL (ref 31.5–35.7)
MCV: 84 fL (ref 79–97)
Platelets: 287 10*3/uL (ref 150–450)
RBC: 5.5 x10E6/uL (ref 4.14–5.80)
RDW: 13 % (ref 11.6–15.4)
WBC: 7.3 10*3/uL (ref 3.4–10.8)

## 2021-03-22 ENCOUNTER — Telehealth: Payer: Self-pay

## 2021-03-22 LAB — GC/CHLAMYDIA PROBE AMP
Chlamydia trachomatis, NAA: NEGATIVE
Neisseria Gonorrhoeae by PCR: NEGATIVE

## 2021-03-22 LAB — SPECIMEN STATUS REPORT

## 2021-03-23 LAB — SPECIMEN STATUS REPORT

## 2021-03-23 LAB — URINE CULTURE: Organism ID, Bacteria: NO GROWTH

## 2021-03-23 NOTE — Telephone Encounter (Signed)
My chart message sent to patient via my chart.

## 2021-04-07 DIAGNOSIS — R319 Hematuria, unspecified: Secondary | ICD-10-CM | POA: Insufficient documentation

## 2021-05-07 ENCOUNTER — Encounter: Payer: Self-pay | Admitting: Family Medicine

## 2021-05-07 ENCOUNTER — Other Ambulatory Visit: Payer: Self-pay | Admitting: Family Medicine

## 2021-05-07 DIAGNOSIS — E785 Hyperlipidemia, unspecified: Secondary | ICD-10-CM | POA: Insufficient documentation

## 2021-05-07 NOTE — Progress Notes (Signed)
error 

## 2021-06-22 ENCOUNTER — Ambulatory Visit: Payer: BC Managed Care – PPO | Admitting: Family Medicine

## 2021-06-25 ENCOUNTER — Telehealth: Payer: Self-pay

## 2021-06-25 ENCOUNTER — Ambulatory Visit (INDEPENDENT_AMBULATORY_CARE_PROVIDER_SITE_OTHER): Payer: BC Managed Care – PPO | Admitting: Family Medicine

## 2021-06-25 DIAGNOSIS — F909 Attention-deficit hyperactivity disorder, unspecified type: Secondary | ICD-10-CM

## 2021-06-25 MED ORDER — AMPHETAMINE-DEXTROAMPHET ER 30 MG PO CP24: 30.0000 mg | ORAL_CAPSULE | ORAL | 0 refills | Status: DC

## 2021-06-25 NOTE — Telephone Encounter (Signed)
I connected with  Ronald Mccarty on 06/25/21 by a video enabled telemedicine application and verified that I am speaking with the correct person using two identifiers.   I discussed the limitations of evaluation and management by telemedicine. The patient expressed understanding and agreed to proceed.

## 2021-06-25 NOTE — Progress Notes (Signed)
I connected with  Ronald Mccarty on 06/25/21 by a video enabled telemedicine application and verified that I am speaking with the correct person using two identifiers.   I discussed the limitations of evaluation and management by telemedicine. The patient expressed understanding and agreed to proceed.  Patient location: home  Provider location: in office  I provided 15 minutes of non face - to - face time during this encounter.    Patient ID: Ronald Mccarty, male    DOB: 1980/11/19, 40 y.o.   MRN: [id number]   Chief Complaint  Patient presents with   adult ADHD   Subjective:    HPI Pt had phone visit for f/u on ADHD.  Pt doing well. No new concerns.  Denies chest pain, palpitations, headache, weight loss, or insomnia. No depression or anxiety feelings.  Taking the medication daily.  Has tried a few days without it and notices not able to focus and more anxiety w/o the medication.  Medical History Braxton has a past medical history of ADHD, Anxiety, GERD (gastroesophageal reflux disease), Hemorrhoids, Hemorrhoids, and Tobacco use.   Outpatient Encounter Medications as of 06/25/2021  Medication Sig   amphetamine-dextroamphetamine (ADDERALL XR) 30 MG 24 hr capsule Take 1 capsule (30 mg total) by mouth every morning.   amphetamine-dextroamphetamine (ADDERALL XR) 30 MG 24 hr capsule Take 1 capsule (30 mg total) by mouth every morning.   amphetamine-dextroamphetamine (ADDERALL XR) 30 MG 24 hr capsule Take 1 capsule (30 mg total) by mouth every morning.   fluticasone (FLONASE) 50 MCG/ACT nasal spray SPRAY 2 SPRAYS INTO EACH NOSTRIL EVERY DAY   pantoprazole (PROTONIX) 40 MG tablet TAKE 1 TABLET BY MOUTH EVERY DAY   [DISCONTINUED] amphetamine-dextroamphetamine (ADDERALL XR) 30 MG 24 hr capsule Take 1 capsule (30 mg total) by mouth every morning.   [DISCONTINUED] amphetamine-dextroamphetamine (ADDERALL XR) 30 MG 24 hr capsule Take 1 capsule (30 mg total) by mouth every morning.    [DISCONTINUED] amphetamine-dextroamphetamine (ADDERALL XR) 30 MG 24 hr capsule Take 1 capsule (30 mg total) by mouth every morning.   [DISCONTINUED] polyethylene glycol (MIRALAX / GLYCOLAX) packet Take 17 g by mouth daily. May take once daily or twice daily for constipation (Patient not taking: Reported on 06/25/2021)   No facility-administered encounter medications on file as of 06/25/2021.     Review of Systems  Constitutional:  Negative for chills and fever.  HENT:  Negative for congestion, rhinorrhea and sore throat.   Respiratory:  Negative for cough, shortness of breath and wheezing.   Cardiovascular:  Negative for chest pain and leg swelling.  Gastrointestinal:  Negative for abdominal pain, diarrhea, nausea and vomiting.  Genitourinary:  Negative for dysuria and frequency.  Skin:  Negative for rash.  Neurological:  Negative for dizziness, weakness and headaches.  Psychiatric/Behavioral:  Negative for decreased concentration, dysphoric mood, self-injury, sleep disturbance and suicidal ideas. The patient is not nervous/anxious and is not hyperactive.     Vitals Ht '5\' 9"'$  (1.753 m)   Wt 245 lb (111.1 kg)   BMI 36.18 kg/m   Objective:   Physical Exam No PE due to phone visit.   Assessment and Plan   1. Adult ADHD - amphetamine-dextroamphetamine (ADDERALL XR) 30 MG 24 hr capsule; Take 1 capsule (30 mg total) by mouth every morning.  Dispense: 30 capsule; Refill: 0 - amphetamine-dextroamphetamine (ADDERALL XR) 30 MG 24 hr capsule; Take 1 capsule (30 mg total) by mouth every morning.  Dispense: 30 capsule; Refill: 0 - amphetamine-dextroamphetamine (ADDERALL XR) 30  MG 24 hr capsule; Take 1 capsule (30 mg total) by mouth every morning.  Dispense: 30 capsule; Refill: 0   Pt doing well and symptoms are controlled.  pmp reviewed. Will cont with this dose.  Pt in agreement.  No follow-ups on file.

## 2021-10-09 ENCOUNTER — Ambulatory Visit (INDEPENDENT_AMBULATORY_CARE_PROVIDER_SITE_OTHER): Payer: BC Managed Care – PPO | Admitting: Family Medicine

## 2021-10-09 ENCOUNTER — Other Ambulatory Visit: Payer: Self-pay

## 2021-10-09 DIAGNOSIS — K219 Gastro-esophageal reflux disease without esophagitis: Secondary | ICD-10-CM

## 2021-10-09 DIAGNOSIS — F909 Attention-deficit hyperactivity disorder, unspecified type: Secondary | ICD-10-CM | POA: Diagnosis not present

## 2021-10-09 MED ORDER — AMPHETAMINE-DEXTROAMPHET ER 30 MG PO CP24
30.0000 mg | ORAL_CAPSULE | ORAL | 0 refills | Status: DC
Start: 2021-11-09 — End: 2021-11-20

## 2021-10-09 MED ORDER — PANTOPRAZOLE SODIUM 40 MG PO TBEC: 40.0000 mg | DELAYED_RELEASE_TABLET | Freq: Every day | ORAL | 3 refills | Status: DC

## 2021-10-09 MED ORDER — AMPHETAMINE-DEXTROAMPHET ER 30 MG PO CP24: 30.0000 mg | ORAL_CAPSULE | ORAL | 0 refills | Status: DC

## 2021-10-09 NOTE — Patient Instructions (Signed)
I have refilled your medications.  Follow up in 6 months.  Take care  Dr. Lacinda Axon

## 2021-10-09 NOTE — Assessment & Plan Note (Signed)
Stable and well-controlled.  Continue Adderall.  Refills sent today.

## 2021-10-09 NOTE — Assessment & Plan Note (Signed)
Has been stable on Protonix.  Refilled today.

## 2021-10-09 NOTE — Progress Notes (Signed)
Subjective:  Patient ID: Ronald Mccarty, male    DOB: 1981/05/29  Age: 40 y.o. MRN: [id number]  CC: Chief Complaint  Patient presents with   ADHD    HPI:  40 year old male with GERD, ADHD presents for follow up.  Patient states that he is doing well on his ADD medication.  He is able to focus and this also helps his anxiety because it is mostly triggered by inability to complete task.  He is in need of refill on his medication today.  GERD has been well controlled on Protonix.  However, he recently ran out and has been experiencing reflux.  He would like a refill today.  Patient is doing well and has no other complaints or concerns at this time.  Patient Active Problem List   Diagnosis Date Noted   Hyperlipidemia 05/07/2021   Vitamin D deficiency 12/26/2017   GERD (gastroesophageal reflux disease) 12/25/2017   Adult ADHD 04/16/2017   Generalized anxiety disorder 04/16/2017    Social Hx   Social History   Socioeconomic History   Marital status: Married    Spouse name: Not on file   Number of children: Not on file   Years of education: Not on file   Highest education level: Not on file  Occupational History   Not on file  Tobacco Use   Smoking status: Former    Packs/day: 1.00    Years: 20.00    Pack years: 20.00    Types: Cigarettes    Start date: 04/27/2020   Smokeless tobacco: Never  Vaping Use   Vaping Use: Never used  Substance and Sexual Activity   Alcohol use: Not Currently    Comment: occ   Drug use: No   Sexual activity: Not on file  Other Topics Concern   Not on file  Social History Narrative   Not on file   Social Determinants of Health   Financial Resource Strain: Not on file  Food Insecurity: Not on file  Transportation Needs: Not on file  Physical Activity: Not on file  Stress: Not on file  Social Connections: Not on file    Review of Systems  Constitutional: Negative.   Gastrointestinal:        GERD.    Objective:  BP 128/70     Pulse (!) 101    Temp 98.4 F (36.9 C)    Ht 5\' 9"  (1.753 m)    Wt 249 lb 12.8 oz (113.3 kg)    SpO2 98%    BMI 36.89 kg/m   BP/Weight 10/09/2021 06/25/2021 2/54/2706  Systolic BP 237 - 628  Diastolic BP 70 - 82  Wt. (Lbs) 249.8 245 245  BMI 36.89 36.18 36.18    Physical Exam Vitals and nursing note reviewed.  Constitutional:      General: He is not in acute distress.    Appearance: Normal appearance. He is not ill-appearing.  Cardiovascular:     Rate and Rhythm: Normal rate and regular rhythm.     Heart sounds: No murmur heard. Pulmonary:     Effort: Pulmonary effort is normal.     Breath sounds: Normal breath sounds. No wheezing or rales.  Neurological:     Mental Status: He is alert.  Psychiatric:        Mood and Affect: Mood normal.        Behavior: Behavior normal.    Lab Results  Component Value Date   WBC 7.3 03/20/2021   HGB 15.1  03/20/2021   HCT 46.3 03/20/2021   PLT 287 03/20/2021   GLUCOSE 98 03/20/2021   CHOL 251 (H) 03/20/2021   TRIG 90 03/20/2021   HDL 70 03/20/2021   LDLCALC 166 (H) 03/20/2021   ALT 21 03/20/2021   AST 21 03/20/2021   NA 144 03/20/2021   K 5.4 (H) 03/20/2021   CL 105 03/20/2021   CREATININE 0.97 03/20/2021   BUN 17 03/20/2021   CO2 25 03/20/2021   TSH 1.353 12/25/2017   INR 1.00 12/25/2017   HGBA1C 5.6 03/20/2021     Assessment & Plan:   Problem List Items Addressed This Visit       Digestive   GERD (gastroesophageal reflux disease)    Has been stable on Protonix.  Refilled today.      Relevant Medications   pantoprazole (PROTONIX) 40 MG tablet     Other   Adult ADHD    Stable and well-controlled.  Continue Adderall.  Refills sent today.      Relevant Medications   amphetamine-dextroamphetamine (ADDERALL XR) 30 MG 24 hr capsule (Start on 12/10/2021)   amphetamine-dextroamphetamine (ADDERALL XR) 30 MG 24 hr capsule (Start on 11/09/2021)   amphetamine-dextroamphetamine (ADDERALL XR) 30 MG 24 hr capsule    Meds  ordered this encounter  Medications   pantoprazole (PROTONIX) 40 MG tablet    Sig: Take 1 tablet (40 mg total) by mouth daily.    Dispense:  90 tablet    Refill:  3   amphetamine-dextroamphetamine (ADDERALL XR) 30 MG 24 hr capsule    Sig: Take 1 capsule (30 mg total) by mouth every morning.    Dispense:  30 capsule    Refill:  0   amphetamine-dextroamphetamine (ADDERALL XR) 30 MG 24 hr capsule    Sig: Take 1 capsule (30 mg total) by mouth every morning.    Dispense:  30 capsule    Refill:  0   amphetamine-dextroamphetamine (ADDERALL XR) 30 MG 24 hr capsule    Sig: Take 1 capsule (30 mg total) by mouth every morning.    Dispense:  30 capsule    Refill:  0    Follow-up:  Return in about 6 months (around 04/09/2022).  [place]

## 2021-11-07 ENCOUNTER — Ambulatory Visit (INDEPENDENT_AMBULATORY_CARE_PROVIDER_SITE_OTHER): Payer: BC Managed Care – PPO | Admitting: Family Medicine

## 2021-11-07 ENCOUNTER — Other Ambulatory Visit: Payer: Self-pay

## 2021-11-07 VITALS — BP 122/81 | HR 92 | Ht 69.0 in | Wt 249.6 lb

## 2021-11-07 DIAGNOSIS — Z0001 Encounter for general adult medical examination with abnormal findings: Secondary | ICD-10-CM

## 2021-11-07 DIAGNOSIS — Z130 Encounter for screening for diseases of the blood and blood-forming organs and certain disorders involving the immune mechanism: Secondary | ICD-10-CM | POA: Diagnosis not present

## 2021-11-07 DIAGNOSIS — E785 Hyperlipidemia, unspecified: Secondary | ICD-10-CM | POA: Diagnosis not present

## 2021-11-07 DIAGNOSIS — Z0000 Encounter for general adult medical examination without abnormal findings: Secondary | ICD-10-CM | POA: Insufficient documentation

## 2021-11-07 DIAGNOSIS — E669 Obesity, unspecified: Secondary | ICD-10-CM | POA: Diagnosis not present

## 2021-11-07 DIAGNOSIS — Z1159 Encounter for screening for other viral diseases: Secondary | ICD-10-CM

## 2021-11-07 NOTE — Patient Instructions (Signed)
Labs (fasting) at your convenience.  Follow up every 6 months regarding ADD medication.  Take care  Dr. Lacinda Axon

## 2021-11-07 NOTE — Assessment & Plan Note (Signed)
Preventative healthcare items discussed.  Needs labs.  Would like hepatitis C screening.  Immunizations up-to-date except for last booster dose of COVID-vaccine.  Patient will consider.  Advise healthy diet and exercise.

## 2021-11-07 NOTE — Progress Notes (Signed)
Subjective:  Patient ID: Ronald Mccarty, male    DOB: 1981/09/15  Age: 41 y.o. MRN: [id number]  CC: Physical  HPI Ronald Mccarty is a 41 y.o. male presents to the clinic today for an annual physical.  He is feeling well. No concerns today.  Preventative Healthcare Immunizations Tetanus - Up to date. Flu - Up to date. Hepatitis C screening - Would like screening. Labs: Needs labs. Exercise: Daily pushups. Alcohol use: 1-2 x/week. Smoking/tobacco use: Former. STD/HIV testing: has had HIV screeing. Regular dental exams: Yes.  PMH, Surgical Hx, Family Hx, Social History reviewed and updated as below.  Past Medical History:  Diagnosis Date   ADHD    Anxiety    GERD (gastroesophageal reflux disease)    Hemorrhoids    Hemorrhoids    s/p sugical resection of 2 columns at [facility].   Tobacco use    Past Surgical History:  Procedure Laterality Date   COLONOSCOPY  2014   [place]   COLONOSCOPY WITH PROPOFOL N/A 12/26/2017   Procedure: COLONOSCOPY WITH PROPOFOL;  Surgeon: Danie Binder, MD;  Location: [facility];  Service: Endoscopy;  Laterality: N/A;   HEMORRHOID SURGERY N/A 12/28/2017   Procedure: HEMORRHOIDECTOMY with exam under anesthesia;  Surgeon: Olean Ree, MD;  Location: [facility] ORS;  Service: General;  Laterality: N/A;   WISDOM TOOTH EXTRACTION  2001   Family History  Problem Relation Age of Onset   Anuerysm Father        gastric   Colon cancer Neg Hx    Social History   Tobacco Use   Smoking status: Former    Packs/day: 1.00    Years: 20.00    Pack years: 20.00    Types: Cigarettes    Start date: 04/27/2020   Smokeless tobacco: Never  Substance Use Topics   Alcohol use: Not Currently    Comment: occ    Review of Systems  Constitutional: Negative.   Respiratory: Negative.    Cardiovascular: Negative.    Objective:   Today's Vitals: BP 122/81    Pulse 92    Ht '5\' 9"'  (1.753 m)    Wt 249 lb 9.6 oz (113.2 kg)    SpO2 99%    BMI 36.86 kg/m    Physical Exam Vitals and nursing note reviewed.  Constitutional:      General: He is not in acute distress.    Appearance: Normal appearance. He is obese.  HENT:     Head: Normocephalic and atraumatic.  Eyes:     General:        Right eye: No discharge.        Left eye: No discharge.     Conjunctiva/sclera: Conjunctivae normal.  Cardiovascular:     Rate and Rhythm: Normal rate.  Pulmonary:     Effort: Pulmonary effort is normal.     Breath sounds: Normal breath sounds.  Abdominal:     General: There is no distension.     Palpations: Abdomen is soft.     Tenderness: There is no abdominal tenderness.  Neurological:     Mental Status: He is alert.  Psychiatric:        Mood and Affect: Mood normal.        Behavior: Behavior normal.     Assessment & Plan:   Problem List Items Addressed This Visit       Other   Hyperlipidemia   Relevant Orders   Lipid panel   Annual physical exam - Primary  Preventative healthcare items discussed.  Needs labs.  Would like hepatitis C screening.  Immunizations up-to-date except for last booster dose of COVID-vaccine.  Patient will consider.  Advise healthy diet and exercise.      Other Visit Diagnoses     Obesity (BMI 30-39.9)       Relevant Orders   CBC   Screening for deficiency anemia       Relevant Orders   CMP14+EGFR   Encounter for hepatitis C screening test for low risk patient       Relevant Orders   Hepatitis C Antibody      Follow-up: Return in about 6 months (around 05/07/2022).  [place]

## 2021-11-13 DIAGNOSIS — M9903 Segmental and somatic dysfunction of lumbar region: Secondary | ICD-10-CM | POA: Diagnosis not present

## 2021-11-13 DIAGNOSIS — M9905 Segmental and somatic dysfunction of pelvic region: Secondary | ICD-10-CM | POA: Diagnosis not present

## 2021-11-13 DIAGNOSIS — M5432 Sciatica, left side: Secondary | ICD-10-CM | POA: Diagnosis not present

## 2021-11-13 DIAGNOSIS — M5431 Sciatica, right side: Secondary | ICD-10-CM | POA: Diagnosis not present

## 2021-11-20 ENCOUNTER — Other Ambulatory Visit: Payer: Self-pay | Admitting: Family Medicine

## 2021-11-20 ENCOUNTER — Encounter: Payer: Self-pay | Admitting: Family Medicine

## 2021-11-20 DIAGNOSIS — F909 Attention-deficit hyperactivity disorder, unspecified type: Secondary | ICD-10-CM

## 2021-11-20 MED ORDER — AMPHETAMINE-DEXTROAMPHET ER 30 MG PO CP24: 30.0000 mg | ORAL_CAPSULE | ORAL | 0 refills | Status: DC

## 2021-11-21 NOTE — Telephone Encounter (Signed)
Pt contacted to confirm which pharmacy. Provider printed out script with correct date and pt came to back door to pick up script.

## 2021-12-20 ENCOUNTER — Encounter: Payer: Self-pay | Admitting: Family Medicine

## 2021-12-20 ENCOUNTER — Other Ambulatory Visit: Payer: Self-pay | Admitting: Family Medicine

## 2021-12-20 DIAGNOSIS — F909 Attention-deficit hyperactivity disorder, unspecified type: Secondary | ICD-10-CM

## 2021-12-20 MED ORDER — AMPHETAMINE-DEXTROAMPHET ER 30 MG PO CP24: 30.0000 mg | ORAL_CAPSULE | ORAL | 0 refills | Status: DC

## 2022-01-18 ENCOUNTER — Other Ambulatory Visit: Payer: Self-pay | Admitting: Family Medicine

## 2022-01-18 ENCOUNTER — Encounter: Payer: Self-pay | Admitting: Family Medicine

## 2022-01-18 DIAGNOSIS — F909 Attention-deficit hyperactivity disorder, unspecified type: Secondary | ICD-10-CM

## 2022-01-18 MED ORDER — AMPHETAMINE-DEXTROAMPHET ER 30 MG PO CP24: 30.0000 mg | ORAL_CAPSULE | ORAL | 0 refills | Status: DC

## 2022-04-10 ENCOUNTER — Encounter: Payer: Self-pay | Admitting: Family Medicine

## 2022-04-10 ENCOUNTER — Ambulatory Visit (INDEPENDENT_AMBULATORY_CARE_PROVIDER_SITE_OTHER): Payer: BC Managed Care – PPO | Admitting: Family Medicine

## 2022-04-10 VITALS — BP 118/70 | HR 92 | Temp 97.8°F | Wt 243.6 lb

## 2022-04-10 DIAGNOSIS — F909 Attention-deficit hyperactivity disorder, unspecified type: Secondary | ICD-10-CM

## 2022-04-10 DIAGNOSIS — E669 Obesity, unspecified: Secondary | ICD-10-CM

## 2022-04-10 DIAGNOSIS — Z130 Encounter for screening for diseases of the blood and blood-forming organs and certain disorders involving the immune mechanism: Secondary | ICD-10-CM

## 2022-04-10 DIAGNOSIS — E785 Hyperlipidemia, unspecified: Secondary | ICD-10-CM

## 2022-04-10 MED ORDER — AMPHETAMINE-DEXTROAMPHET ER 30 MG PO CP24: 30.0000 mg | ORAL_CAPSULE | ORAL | 0 refills | Status: DC

## 2022-04-10 NOTE — Assessment & Plan Note (Signed)
Stable.  Continue current medication.  Refilled today.

## 2022-04-10 NOTE — Progress Notes (Signed)
Subjective:  Patient ID: Ronald Mccarty, male    DOB: 05/04/1981  Age: 41 y.o. MRN: [id number]  CC: Chief Complaint  Patient presents with   ADHD    Pt following up on Adderall XR 30 mg; pt doing well. Pt has been getting paper scripts due to shortage    HPI:  41 year old male with ADHD, GERD, hyperlipidemia presents for follow-up.  Patient is in need of his regular labs.  Patient states that his ADHD is stable.  He is doing well on Adderall.  He is not having any side effects.  It works well for him.  The only issue he is currently having his being able to find this medication at local pharmacies.  He is requesting refill today.  Patient Active Problem List   Diagnosis Date Noted   Annual physical exam 11/07/2021   Hyperlipidemia 05/07/2021   GERD (gastroesophageal reflux disease) 12/25/2017   Adult ADHD 04/16/2017   Generalized anxiety disorder 04/16/2017    Social Hx   Social History   Socioeconomic History   Marital status: Married    Spouse name: Not on file   Number of children: Not on file   Years of education: Not on file   Highest education level: Not on file  Occupational History   Not on file  Tobacco Use   Smoking status: Former    Packs/day: 1.00    Years: 20.00    Total pack years: 20.00    Types: Cigarettes    Start date: 04/27/2020   Smokeless tobacco: Never  Vaping Use   Vaping Use: Never used  Substance and Sexual Activity   Alcohol use: Not Currently    Comment: occ   Drug use: No   Sexual activity: Not on file  Other Topics Concern   Not on file  Social History Narrative   Not on file   Social Determinants of Health   Financial Resource Strain: Not on file  Food Insecurity: Not on file  Transportation Needs: Not on file  Physical Activity: Not on file  Stress: Not on file  Social Connections: Not on file    Review of Systems  Constitutional: Negative.   Respiratory: Negative.    Cardiovascular: Negative.    Objective:  BP  118/70   Pulse 92   Temp 97.8 F (36.6 C)   Wt 243 lb 9.6 oz (110.5 kg)   SpO2 98%   BMI 35.97 kg/m      04/10/2022    8:32 AM 11/07/2021    1:30 PM 10/09/2021   10:39 AM  BP/Weight  Systolic BP 371 062 694  Diastolic BP 70 81 70  Wt. (Lbs) 243.6 249.6 249.8  BMI 35.97 kg/m2 36.86 kg/m2 36.89 kg/m2    Physical Exam Vitals and nursing note reviewed.  Constitutional:      General: He is not in acute distress.    Appearance: Normal appearance. He is not ill-appearing.  HENT:     Head: Normocephalic and atraumatic.  Eyes:     General:        Right eye: No discharge.        Left eye: No discharge.     Conjunctiva/sclera: Conjunctivae normal.  Cardiovascular:     Rate and Rhythm: Normal rate and regular rhythm.  Pulmonary:     Effort: Pulmonary effort is normal.     Breath sounds: Normal breath sounds. No wheezing or rales.  Neurological:     Mental Status: He is alert.  Psychiatric:        Mood and Affect: Mood normal.        Behavior: Behavior normal.     Lab Results  Component Value Date   WBC 7.3 03/20/2021   HGB 15.1 03/20/2021   HCT 46.3 03/20/2021   PLT 287 03/20/2021   GLUCOSE 98 03/20/2021   CHOL 251 (H) 03/20/2021   TRIG 90 03/20/2021   HDL 70 03/20/2021   LDLCALC 166 (H) 03/20/2021   ALT 21 03/20/2021   AST 21 03/20/2021   NA 144 03/20/2021   K 5.4 (H) 03/20/2021   CL 105 03/20/2021   CREATININE 0.97 03/20/2021   BUN 17 03/20/2021   CO2 25 03/20/2021   TSH 1.353 12/25/2017   INR 1.00 12/25/2017   HGBA1C 5.6 03/20/2021     Assessment & Plan:   Problem List Items Addressed This Visit       Other   Adult ADHD - Primary    Stable.  Continue current medication.  Refilled today.      Relevant Medications   amphetamine-dextroamphetamine (ADDERALL XR) 30 MG 24 hr capsule (Start on 06/10/2022)   amphetamine-dextroamphetamine (ADDERALL XR) 30 MG 24 hr capsule (Start on 05/10/2022)   amphetamine-dextroamphetamine (ADDERALL XR) 30 MG 24 hr  capsule   Hyperlipidemia   Relevant Orders   Lipid panel   Other Visit Diagnoses     Screening for deficiency anemia       Relevant Orders   CBC   Obesity (BMI 30-39.9)       Relevant Medications   amphetamine-dextroamphetamine (ADDERALL XR) 30 MG 24 hr capsule (Start on 06/10/2022)   amphetamine-dextroamphetamine (ADDERALL XR) 30 MG 24 hr capsule (Start on 05/10/2022)   amphetamine-dextroamphetamine (ADDERALL XR) 30 MG 24 hr capsule   Other Relevant Orders   CMP14+EGFR       Meds ordered this encounter  Medications   amphetamine-dextroamphetamine (ADDERALL XR) 30 MG 24 hr capsule    Sig: Take 1 capsule (30 mg total) by mouth every morning.    Dispense:  30 capsule    Refill:  0   amphetamine-dextroamphetamine (ADDERALL XR) 30 MG 24 hr capsule    Sig: Take 1 capsule (30 mg total) by mouth every morning.    Dispense:  30 capsule    Refill:  0   amphetamine-dextroamphetamine (ADDERALL XR) 30 MG 24 hr capsule    Sig: Take 1 capsule (30 mg total) by mouth every morning.    Dispense:  30 capsule    Refill:  0    Follow-up:  Return in about 3 months (around 07/11/2022).  [place]

## 2022-04-10 NOTE — Patient Instructions (Signed)
Labs today.  I have refilled your medication.  Follow up in 3 months.

## 2022-04-11 LAB — CMP14+EGFR
ALT: 19 IU/L (ref 0–44)
AST: 25 IU/L (ref 0–40)
Albumin/Globulin Ratio: 2.6 — ABNORMAL HIGH (ref 1.2–2.2)
Albumin: 5.1 g/dL — ABNORMAL HIGH (ref 4.0–5.0)
Alkaline Phosphatase: 64 IU/L (ref 44–121)
BUN/Creatinine Ratio: 17 (ref 9–20)
BUN: 19 mg/dL (ref 6–24)
Bilirubin Total: 0.3 mg/dL (ref 0.0–1.2)
CO2: 23 mmol/L (ref 20–29)
Calcium: 9.7 mg/dL (ref 8.7–10.2)
Chloride: 102 mmol/L (ref 96–106)
Creatinine, Ser: 1.13 mg/dL (ref 0.76–1.27)
Globulin, Total: 2 g/dL (ref 1.5–4.5)
Glucose: 105 mg/dL — ABNORMAL HIGH (ref 70–99)
Potassium: 5.3 mmol/L — ABNORMAL HIGH (ref 3.5–5.2)
Sodium: 139 mmol/L (ref 134–144)
Total Protein: 7.1 g/dL (ref 6.0–8.5)
eGFR: 83 mL/min/{1.73_m2} (ref >59)

## 2022-04-11 LAB — CBC
Hematocrit: 45.4 % (ref 37.5–51.0)
Hemoglobin: 14.8 g/dL (ref 13.0–17.7)
MCH: 27.6 pg (ref 26.6–33.0)
MCHC: 32.6 g/dL (ref 31.5–35.7)
MCV: 85 fL (ref 79–97)
Platelets: 361 10*3/uL (ref 150–450)
RBC: 5.36 x10E6/uL (ref 4.14–5.80)
RDW: 13.4 % (ref 11.6–15.4)
WBC: 5.8 10*3/uL (ref 3.4–10.8)

## 2022-04-11 LAB — LIPID PANEL
Chol/HDL Ratio: 3.2 ratio (ref 0.0–5.0)
Cholesterol, Total: 210 mg/dL — ABNORMAL HIGH (ref 100–199)
HDL: 66 mg/dL (ref >39)
LDL Chol Calc (NIH): 130 mg/dL — ABNORMAL HIGH (ref 0–99)
Triglycerides: 81 mg/dL (ref 0–149)
VLDL Cholesterol Cal: 14 mg/dL (ref 5–40)

## 2022-06-12 DIAGNOSIS — M9903 Segmental and somatic dysfunction of lumbar region: Secondary | ICD-10-CM | POA: Diagnosis not present

## 2022-06-12 DIAGNOSIS — M5431 Sciatica, right side: Secondary | ICD-10-CM | POA: Diagnosis not present

## 2022-06-12 DIAGNOSIS — M5432 Sciatica, left side: Secondary | ICD-10-CM | POA: Diagnosis not present

## 2022-06-12 DIAGNOSIS — M9905 Segmental and somatic dysfunction of pelvic region: Secondary | ICD-10-CM | POA: Diagnosis not present

## 2022-07-11 ENCOUNTER — Ambulatory Visit (INDEPENDENT_AMBULATORY_CARE_PROVIDER_SITE_OTHER): Payer: BC Managed Care – PPO | Admitting: Family Medicine

## 2022-07-11 VITALS — BP 124/84 | HR 77 | Temp 98.2°F | Ht 69.0 in | Wt 228.0 lb

## 2022-07-11 DIAGNOSIS — Z23 Encounter for immunization: Secondary | ICD-10-CM | POA: Diagnosis not present

## 2022-07-11 DIAGNOSIS — F909 Attention-deficit hyperactivity disorder, unspecified type: Secondary | ICD-10-CM | POA: Diagnosis not present

## 2022-07-11 MED ORDER — AMPHETAMINE-DEXTROAMPHET ER 30 MG PO CP24: 30.0000 mg | ORAL_CAPSULE | ORAL | 0 refills | Status: DC

## 2022-07-11 NOTE — Progress Notes (Signed)
Subjective:  Patient ID: Ronald Mccarty, male    DOB: Jun 22, 1981  Age: 41 y.o. MRN: [id number]  CC: Chief Complaint  Patient presents with   ADHD    Written script for adderall    HPI:  41 year old male presents for follow-up regarding ADHD.  Patient is on Adderall XR 30 mg daily.  Doing well.  Needs refills today.  He is compliant with the medication and states that it works well.  No side effects.  Denies chest pain or shortness of breath.  He would like his flu vaccine today.  Patient Active Problem List   Diagnosis Date Noted   Annual physical exam 11/07/2021   Hyperlipidemia 05/07/2021   GERD (gastroesophageal reflux disease) 12/25/2017   Adult ADHD 04/16/2017   Generalized anxiety disorder 04/16/2017    Social Hx   Social History   Socioeconomic History   Marital status: Married    Spouse name: Not on file   Number of children: Not on file   Years of education: Not on file   Highest education level: Not on file  Occupational History   Not on file  Tobacco Use   Smoking status: Former    Packs/day: 1.00    Years: 20.00    Total pack years: 20.00    Types: Cigarettes    Start date: 04/27/2020   Smokeless tobacco: Never  Vaping Use   Vaping Use: Never used  Substance and Sexual Activity   Alcohol use: Not Currently    Comment: occ   Drug use: No   Sexual activity: Not on file  Other Topics Concern   Not on file  Social History Narrative   Not on file   Social Determinants of Health   Financial Resource Strain: Not on file  Food Insecurity: Not on file  Transportation Needs: Not on file  Physical Activity: Not on file  Stress: Not on file  Social Connections: Not on file    Review of Systems Per HPI  Objective:  BP 124/84   Pulse 77   Temp 98.2 F (36.8 C)   Ht '5\' 9"'$  (1.753 m)   Wt 228 lb (103.4 kg)   SpO2 97%   BMI 33.67 kg/m      07/11/2022    8:30 AM 04/10/2022    8:32 AM 11/07/2021    1:30 PM  BP/Weight  Systolic BP 188 416  606  Diastolic BP 84 70 81  Wt. (Lbs) 228 243.6 249.6  BMI 33.67 kg/m2 35.97 kg/m2 36.86 kg/m2    Physical Exam Vitals and nursing note reviewed.  Constitutional:      General: He is not in acute distress.    Appearance: Normal appearance. He is not ill-appearing.  HENT:     Head: Normocephalic and atraumatic.  Eyes:     General:        Right eye: No discharge.        Left eye: No discharge.     Conjunctiva/sclera: Conjunctivae normal.  Cardiovascular:     Rate and Rhythm: Normal rate and regular rhythm.  Pulmonary:     Effort: Pulmonary effort is normal.     Breath sounds: Normal breath sounds. No wheezing, rhonchi or rales.  Neurological:     Mental Status: He is alert.  Psychiatric:        Mood and Affect: Mood normal.        Behavior: Behavior normal.     Lab Results  Component Value Date   WBC  5.8 04/10/2022   HGB 14.8 04/10/2022   HCT 45.4 04/10/2022   PLT 361 04/10/2022   GLUCOSE 105 (H) 04/10/2022   CHOL 210 (H) 04/10/2022   TRIG 81 04/10/2022   HDL 66 04/10/2022   LDLCALC 130 (H) 04/10/2022   ALT 19 04/10/2022   AST 25 04/10/2022   NA 139 04/10/2022   K 5.3 (H) 04/10/2022   CL 102 04/10/2022   CREATININE 1.13 04/10/2022   BUN 19 04/10/2022   CO2 23 04/10/2022   TSH 1.353 12/25/2017   INR 1.00 12/25/2017   HGBA1C 5.6 03/20/2021     Assessment & Plan:   Problem List Items Addressed This Visit       Other   Adult ADHD - Primary    Doing well.  BP normal.  No side effects.  He is compliant. Medications refilled today.  [place] controlled substance database reviewed.      Relevant Medications   amphetamine-dextroamphetamine (ADDERALL XR) 30 MG 24 hr capsule (Start on 09/10/2022)   amphetamine-dextroamphetamine (ADDERALL XR) 30 MG 24 hr capsule (Start on 08/10/2022)   amphetamine-dextroamphetamine (ADDERALL XR) 30 MG 24 hr capsule   Other Visit Diagnoses     Immunization due       Relevant Orders   Flu Vaccine QUAD 18moIM (Fluarix,  Fluzone & Alfiuria Quad PF)       Meds ordered this encounter  Medications   amphetamine-dextroamphetamine (ADDERALL XR) 30 MG 24 hr capsule    Sig: Take 1 capsule (30 mg total) by mouth every morning.    Dispense:  30 capsule    Refill:  0   amphetamine-dextroamphetamine (ADDERALL XR) 30 MG 24 hr capsule    Sig: Take 1 capsule (30 mg total) by mouth every morning.    Dispense:  30 capsule    Refill:  0   amphetamine-dextroamphetamine (ADDERALL XR) 30 MG 24 hr capsule    Sig: Take 1 capsule (30 mg total) by mouth every morning.    Dispense:  30 capsule    Refill:  0    Follow-up:  Return in about 3 months (around 10/10/2022).  [place]

## 2022-07-11 NOTE — Patient Instructions (Signed)
Continue your medications.  Follow up in 3 months.  Take care  Dr. Lorianna Spadaccini  

## 2022-07-11 NOTE — Assessment & Plan Note (Signed)
Doing well.  BP normal.  No side effects.  He is compliant. Medications refilled today.  [place] controlled substance database reviewed.

## 2022-09-06 DIAGNOSIS — L729 Follicular cyst of the skin and subcutaneous tissue, unspecified: Secondary | ICD-10-CM | POA: Diagnosis not present

## 2022-09-20 DIAGNOSIS — S76012A Strain of muscle, fascia and tendon of left hip, initial encounter: Secondary | ICD-10-CM | POA: Diagnosis not present

## 2022-09-30 DIAGNOSIS — S76012A Strain of muscle, fascia and tendon of left hip, initial encounter: Secondary | ICD-10-CM | POA: Diagnosis not present

## 2022-10-10 ENCOUNTER — Ambulatory Visit (INDEPENDENT_AMBULATORY_CARE_PROVIDER_SITE_OTHER): Payer: BC Managed Care – PPO | Admitting: Family Medicine

## 2022-10-10 DIAGNOSIS — F909 Attention-deficit hyperactivity disorder, unspecified type: Secondary | ICD-10-CM | POA: Diagnosis not present

## 2022-10-10 MED ORDER — AMPHETAMINE-DEXTROAMPHET ER 30 MG PO CP24: 30.0000 mg | ORAL_CAPSULE | ORAL | 0 refills | Status: DC

## 2022-10-10 NOTE — Progress Notes (Signed)
Subjective:  Patient ID: Ronald Mccarty, male    DOB: 1981-08-23  Age: 41 y.o. MRN: [id number]  CC: Chief Complaint  Patient presents with   ADHD    Paper prescriptions requested    HPI:  41 year old male presents for followup regarding ADHD.  Doing well on Adderall XR 30 mg. Medication works well. No side effects. Feeling well. No other complaints or concerns at this time.  Patient Active Problem List   Diagnosis Date Noted   Annual physical exam 11/07/2021   Hyperlipidemia 05/07/2021   GERD (gastroesophageal reflux disease) 12/25/2017   Adult ADHD 04/16/2017   Generalized anxiety disorder 04/16/2017    Social Hx   Social History   Socioeconomic History   Marital status: Married    Spouse name: Not on file   Number of children: Not on file   Years of education: Not on file   Highest education level: Not on file  Occupational History   Not on file  Tobacco Use   Smoking status: Former    Packs/day: 1.00    Years: 20.00    Total pack years: 20.00    Types: Cigarettes    Start date: 04/27/2020   Smokeless tobacco: Never  Vaping Use   Vaping Use: Never used  Substance and Sexual Activity   Alcohol use: Not Currently    Comment: occ   Drug use: No   Sexual activity: Not on file  Other Topics Concern   Not on file  Social History Narrative   Not on file   Social Determinants of Health   Financial Resource Strain: Not on file  Food Insecurity: Not on file  Transportation Needs: Not on file  Physical Activity: Not on file  Stress: Not on file  Social Connections: Not on file    Review of Systems Per HPI  Objective:  BP 128/85   Pulse 82   Temp 97.9 F (36.6 C)   Wt 222 lb (100.7 kg)   SpO2 97%   BMI 32.78 kg/m      10/10/2022    3:04 PM 07/11/2022    8:30 AM 04/10/2022    8:32 AM  BP/Weight  Systolic BP 962 229 798  Diastolic BP 85 84 70  Wt. (Lbs) 222 228 243.6  BMI 32.78 kg/m2 33.67 kg/m2 35.97 kg/m2    Physical Exam Vitals and  nursing note reviewed.  Constitutional:      Appearance: Normal appearance.  HENT:     Head: Normocephalic and atraumatic.  Cardiovascular:     Rate and Rhythm: Normal rate and regular rhythm.  Pulmonary:     Effort: Pulmonary effort is normal.     Breath sounds: Normal breath sounds.  Neurological:     Mental Status: He is alert.  Psychiatric:        Mood and Affect: Mood normal.        Behavior: Behavior normal.     Lab Results  Component Value Date   WBC 5.8 04/10/2022   HGB 14.8 04/10/2022   HCT 45.4 04/10/2022   PLT 361 04/10/2022   GLUCOSE 105 (H) 04/10/2022   CHOL 210 (H) 04/10/2022   TRIG 81 04/10/2022   HDL 66 04/10/2022   LDLCALC 130 (H) 04/10/2022   ALT 19 04/10/2022   AST 25 04/10/2022   NA 139 04/10/2022   K 5.3 (H) 04/10/2022   CL 102 04/10/2022   CREATININE 1.13 04/10/2022   BUN 19 04/10/2022   CO2 23 04/10/2022  TSH 1.353 12/25/2017   INR 1.00 12/25/2017   HGBA1C 5.6 03/20/2021     Assessment & Plan:   Problem List Items Addressed This Visit       Other   Adult ADHD    Stable. Doing well. Medications refilled today.      Relevant Medications   amphetamine-dextroamphetamine (ADDERALL XR) 30 MG 24 hr capsule (Start on 12/11/2022)   amphetamine-dextroamphetamine (ADDERALL XR) 30 MG 24 hr capsule (Start on 11/10/2022)   amphetamine-dextroamphetamine (ADDERALL XR) 30 MG 24 hr capsule    Meds ordered this encounter  Medications   amphetamine-dextroamphetamine (ADDERALL XR) 30 MG 24 hr capsule    Sig: Take 1 capsule (30 mg total) by mouth every morning.    Dispense:  30 capsule    Refill:  0   amphetamine-dextroamphetamine (ADDERALL XR) 30 MG 24 hr capsule    Sig: Take 1 capsule (30 mg total) by mouth every morning.    Dispense:  30 capsule    Refill:  0   amphetamine-dextroamphetamine (ADDERALL XR) 30 MG 24 hr capsule    Sig: Take 1 capsule (30 mg total) by mouth every morning.    Dispense:  30 capsule    Refill:  0    Follow-up:   3-6 months.   [place]

## 2022-10-10 NOTE — Patient Instructions (Signed)
Follow up in 3-6 months.  Take care  Dr. Lacinda Axon

## 2022-10-10 NOTE — Assessment & Plan Note (Signed)
Stable. Doing well. Medications refilled today.

## 2022-10-29 DIAGNOSIS — M5416 Radiculopathy, lumbar region: Secondary | ICD-10-CM | POA: Diagnosis not present

## 2022-10-29 DIAGNOSIS — M9903 Segmental and somatic dysfunction of lumbar region: Secondary | ICD-10-CM | POA: Diagnosis not present

## 2022-10-29 DIAGNOSIS — M62830 Muscle spasm of back: Secondary | ICD-10-CM | POA: Diagnosis not present

## 2022-10-29 DIAGNOSIS — M9902 Segmental and somatic dysfunction of thoracic region: Secondary | ICD-10-CM | POA: Diagnosis not present

## 2023-01-09 ENCOUNTER — Encounter: Payer: Self-pay | Admitting: Family Medicine

## 2023-01-09 ENCOUNTER — Other Ambulatory Visit: Payer: Self-pay

## 2023-01-09 ENCOUNTER — Other Ambulatory Visit: Payer: Self-pay | Admitting: Family Medicine

## 2023-01-09 DIAGNOSIS — F909 Attention-deficit hyperactivity disorder, unspecified type: Secondary | ICD-10-CM

## 2023-01-09 MED ORDER — PANTOPRAZOLE SODIUM 40 MG PO TBEC: 40.0000 mg | DELAYED_RELEASE_TABLET | Freq: Every day | ORAL | 1 refills | Status: DC

## 2023-01-09 MED ORDER — AMPHETAMINE-DEXTROAMPHET ER 30 MG PO CP24: 30.0000 mg | ORAL_CAPSULE | ORAL | 0 refills | Status: DC

## 2023-02-07 ENCOUNTER — Other Ambulatory Visit: Payer: Self-pay

## 2023-02-07 ENCOUNTER — Other Ambulatory Visit: Payer: Self-pay | Admitting: Family Medicine

## 2023-02-07 ENCOUNTER — Encounter: Payer: Self-pay | Admitting: Family Medicine

## 2023-02-07 DIAGNOSIS — F909 Attention-deficit hyperactivity disorder, unspecified type: Secondary | ICD-10-CM

## 2023-02-07 MED ORDER — AMPHETAMINE-DEXTROAMPHET ER 30 MG PO CP24: 30.0000 mg | ORAL_CAPSULE | ORAL | 0 refills | Status: DC

## 2023-02-10 ENCOUNTER — Other Ambulatory Visit: Payer: Self-pay | Admitting: Family Medicine

## 2023-02-10 DIAGNOSIS — F909 Attention-deficit hyperactivity disorder, unspecified type: Secondary | ICD-10-CM

## 2023-02-10 MED ORDER — AMPHETAMINE-DEXTROAMPHET ER 30 MG PO CP24: 30.0000 mg | ORAL_CAPSULE | ORAL | 0 refills | Status: DC

## 2023-03-12 ENCOUNTER — Encounter: Payer: Self-pay | Admitting: Family Medicine

## 2023-03-12 ENCOUNTER — Other Ambulatory Visit: Payer: Self-pay | Admitting: Family Medicine

## 2023-03-12 DIAGNOSIS — F909 Attention-deficit hyperactivity disorder, unspecified type: Secondary | ICD-10-CM

## 2023-03-12 MED ORDER — AMPHETAMINE-DEXTROAMPHET ER 30 MG PO CP24
30.0000 mg | ORAL_CAPSULE | ORAL | 0 refills | Status: DC
Start: 2023-03-12 — End: 2023-04-14

## 2023-04-11 ENCOUNTER — Ambulatory Visit: Payer: BC Managed Care – PPO | Admitting: Family Medicine

## 2023-04-14 ENCOUNTER — Encounter: Payer: Self-pay | Admitting: Family Medicine

## 2023-04-14 ENCOUNTER — Ambulatory Visit: Payer: BC Managed Care – PPO | Admitting: Family Medicine

## 2023-04-14 VITALS — BP 106/64 | HR 97 | Temp 98.1°F | Ht 69.0 in | Wt 226.0 lb

## 2023-04-14 DIAGNOSIS — F909 Attention-deficit hyperactivity disorder, unspecified type: Secondary | ICD-10-CM

## 2023-04-14 DIAGNOSIS — K219 Gastro-esophageal reflux disease without esophagitis: Secondary | ICD-10-CM

## 2023-04-14 MED ORDER — AMPHETAMINE-DEXTROAMPHET ER 30 MG PO CP24: 30.0000 mg | ORAL_CAPSULE | ORAL | 0 refills | Status: DC

## 2023-04-14 MED ORDER — AMPHETAMINE-DEXTROAMPHET ER 30 MG PO CP24
30.0000 mg | ORAL_CAPSULE | ORAL | 0 refills | Status: DC
Start: 2023-04-14 — End: 2023-07-14

## 2023-04-14 NOTE — Assessment & Plan Note (Signed)
Stable. Continue Adderall. Refilled today. 

## 2023-04-14 NOTE — Patient Instructions (Signed)
Continue your medications.  Follow up in 3 months.  

## 2023-04-14 NOTE — Progress Notes (Signed)
Subjective:  Patient ID: Ronald Mccarty, male    DOB: [birth year]  Age: 42 y.o. MRN: [id number]  CC: Chief Complaint  Patient presents with   Medication Follow Up    HPI:  42 year old male presents for follow-up.  ADHD is stable on Adderall.  He is doing well.  No side effects.  He states that is working well.  Needs refill.  GERD stable on Protonix.  We discussed dietary and lifestyle changes to help control this without the need for medication.  Patient Active Problem List   Diagnosis Date Noted   Hyperlipidemia 05/07/2021   GERD (gastroesophageal reflux disease) 12/25/2017   Adult ADHD 04/16/2017   Generalized anxiety disorder 04/16/2017    Social Hx   Social History   Socioeconomic History   Marital status: Married    Spouse name: Not on file   Number of children: Not on file   Years of education: Not on file   Highest education level: Bachelor's degree (e.g., BA, AB, BS)  Occupational History   Not on file  Tobacco Use   Smoking status: Former    Packs/day: 1.00    Years: 20.00    Additional pack years: 0.00    Total pack years: 20.00    Types: Cigarettes    Start date: 04/27/2020   Smokeless tobacco: Never  Vaping Use   Vaping Use: Never used  Substance and Sexual Activity   Alcohol use: Not Currently    Comment: occ   Drug use: No   Sexual activity: Not on file  Other Topics Concern   Not on file  Social History Narrative   Not on file   Social Determinants of Health   Financial Resource Strain: Medium Risk (04/13/2023)   Overall Financial Resource Strain (CARDIA)    Difficulty of Paying Living Expenses: Somewhat hard  Food Insecurity: No Food Insecurity (04/13/2023)   Hunger Vital Sign    Worried About Running Out of Food in the Last Year: Never true    Ran Out of Food in the Last Year: Never true  Transportation Needs: No Transportation Needs (04/13/2023)   PRAPARE - Administrator, Civil Service (Medical): No    Lack of  Transportation (Non-Medical): No  Physical Activity: Sufficiently Active (04/13/2023)   Exercise Vital Sign    Days of Exercise per Week: 5 days    Minutes of Exercise per Session: 60 min  Stress: Stress Concern Present (04/13/2023)   [employer] of Occupational Health - Occupational Stress Questionnaire    Feeling of Stress : To some extent  Social Connections: Socially Isolated (04/13/2023)   Social Connection and Isolation Panel [NHANES]    Frequency of Communication with Friends and Family: Never    Frequency of Social Gatherings with Friends and Family: Never    Attends Religious Services: Never    Diplomatic Services operational officer: No    Attends Engineer, structural: Not on file    Marital Status: Married    Review of Systems Per HPI  Objective:  BP 106/64   Pulse 97   Temp 98.1 F (36.7 C)   Ht 5\' 9"  (1.753 m)   Wt 226 lb (102.5 kg)   SpO2 98%   BMI 33.37 kg/m      04/14/2023    1:06 PM 10/10/2022    3:04 PM 07/11/2022    8:30 AM  BP/Weight  Systolic BP 106 128 124  Diastolic BP 64 85 84  Wt. (  Lbs) 226 222 228  BMI 33.37 kg/m2 32.78 kg/m2 33.67 kg/m2    Physical Exam Vitals and nursing note reviewed.  Constitutional:      General: He is not in acute distress.    Appearance: Normal appearance.  HENT:     Head: Normocephalic and atraumatic.  Eyes:     General:        Right eye: No discharge.        Left eye: No discharge.     Conjunctiva/sclera: Conjunctivae normal.  Cardiovascular:     Rate and Rhythm: Normal rate and regular rhythm.  Pulmonary:     Effort: Pulmonary effort is normal.     Breath sounds: Normal breath sounds. No wheezing, rhonchi or rales.  Neurological:     Mental Status: He is alert.  Psychiatric:        Mood and Affect: Mood normal.        Behavior: Behavior normal.     Lab Results  Component Value Date   WBC 5.8 04/10/2022   HGB 14.8 04/10/2022   HCT 45.4 04/10/2022   PLT 361 04/10/2022   GLUCOSE 105  (H) 04/10/2022   CHOL 210 (H) 04/10/2022   TRIG 81 04/10/2022   HDL 66 04/10/2022   LDLCALC 130 (H) 04/10/2022   ALT 19 04/10/2022   AST 25 04/10/2022   NA 139 04/10/2022   K 5.3 (H) 04/10/2022   CL 102 04/10/2022   CREATININE 1.13 04/10/2022   BUN 19 04/10/2022   CO2 23 04/10/2022   TSH 1.353 12/25/2017   INR 1.00 12/25/2017   HGBA1C 5.6 03/20/2021     Assessment & Plan:   Problem List Items Addressed This Visit       Digestive   GERD (gastroesophageal reflux disease)    Stable on Protonix.  Continue.  Advised dietary and lifestyle changes to help control this without the need for medication.        Other   Adult ADHD - Primary    Stable.  Continue Adderall.  Refilled today.      Relevant Medications   amphetamine-dextroamphetamine (ADDERALL XR) 30 MG 24 hr capsule (Start on 06/14/2023)   amphetamine-dextroamphetamine (ADDERALL XR) 30 MG 24 hr capsule (Start on 05/14/2023)   amphetamine-dextroamphetamine (ADDERALL XR) 30 MG 24 hr capsule    Meds ordered this encounter  Medications   amphetamine-dextroamphetamine (ADDERALL XR) 30 MG 24 hr capsule    Sig: Take 1 capsule (30 mg total) by mouth every morning.    Dispense:  30 capsule    Refill:  0   amphetamine-dextroamphetamine (ADDERALL XR) 30 MG 24 hr capsule    Sig: Take 1 capsule (30 mg total) by mouth every morning.    Dispense:  30 capsule    Refill:  0   amphetamine-dextroamphetamine (ADDERALL XR) 30 MG 24 hr capsule    Sig: Take 1 capsule (30 mg total) by mouth every morning.    Dispense:  30 capsule    Refill:  0    Follow-up:  Return in about 3 months (around 07/15/2023).  Everlene Other DO [facility]

## 2023-04-14 NOTE — Assessment & Plan Note (Signed)
Stable on Protonix.  Continue.  Advised dietary and lifestyle changes to help control this without the need for medication.

## 2023-05-04 ENCOUNTER — Ambulatory Visit
Admission: EM | Admit: 2023-05-04 | Discharge: 2023-05-04 | Disposition: A | Discharge status: Home / Self Care | Payer: BC Managed Care – PPO

## 2023-05-04 DIAGNOSIS — R21 Rash and other nonspecific skin eruption: Secondary | ICD-10-CM | POA: Diagnosis not present

## 2023-05-04 NOTE — Discharge Instructions (Addendum)
Follow up here or with your primary care provider if your symptoms are worsening or not improving with treatment.     

## 2023-05-04 NOTE — ED Provider Notes (Addendum)
Ronald Mccarty    CSN: [id number] Arrival date & time: 05/04/23  1243      History   Chief Complaint Chief Complaint  Patient presents with   Insect Bite    HPI Ronald Mccarty is a 42 y.o. male.   HPI  Presents to urgent care with concern for past spider bite to top of left foot.  He states the incident occurred about 1 month ago, seemed to have resolved, then recurred about 1 week ago. Reports worsening redness at the site.   Past Medical History:  Diagnosis Date   ADHD    Anxiety    GERD (gastroesophageal reflux disease)    Hemorrhoids    Hemorrhoids    s/p sugical resection of 2 columns at [facility].   Tobacco use     Patient Active Problem List   Diagnosis Date Noted   Hyperlipidemia 05/07/2021   GERD (gastroesophageal reflux disease) 12/25/2017   Adult ADHD 04/16/2017   Generalized anxiety disorder 04/16/2017    Past Surgical History:  Procedure Laterality Date   COLONOSCOPY  2014   [place]   COLONOSCOPY WITH PROPOFOL N/A 12/26/2017   Procedure: COLONOSCOPY WITH PROPOFOL;  Surgeon: Ronald Bali, MD;  Location: [facility];  Service: Endoscopy;  Laterality: N/A;   HEMORRHOID SURGERY N/A 12/28/2017   Procedure: HEMORRHOIDECTOMY with exam under anesthesia;  Surgeon: Ronald Dodge, MD;  Location: [facility] ORS;  Service: General;  Laterality: N/A;   WISDOM TOOTH EXTRACTION  2001       Home Medications    Prior to Admission medications   Medication Sig Start Date End Date Taking? Authorizing Provider  amphetamine-dextroamphetamine (ADDERALL XR) 30 MG 24 hr capsule Take 1 capsule (30 mg total) by mouth every morning. 06/14/23   Ronald Sams, DO  amphetamine-dextroamphetamine (ADDERALL XR) 30 MG 24 hr capsule Take 1 capsule (30 mg total) by mouth every morning. 05/14/23   Ronald Sams, DO  amphetamine-dextroamphetamine (ADDERALL XR) 30 MG 24 hr capsule Take 1 capsule (30 mg total) by mouth every morning. 04/14/23   Ronald Sams, DO  pantoprazole  (PROTONIX) 40 MG tablet Take 1 tablet (40 mg total) by mouth daily. 01/09/23   Ronald Sams, DO    Family History Family History  Problem Relation Age of Onset   Anuerysm Father        gastric   Colon cancer Neg Hx     Social History Social History   Tobacco Use   Smoking status: Former    Packs/day: 1.00    Years: 20.00    Additional pack years: 0.00    Total pack years: 20.00    Types: Cigarettes    Start date: 04/27/2020   Smokeless tobacco: Never  Vaping Use   Vaping Use: Never used  Substance Use Topics   Alcohol use: Not Currently    Comment: occ   Drug use: No     Allergies   Aspirin   Review of Systems Review of Systems   Physical Exam Triage Vital Signs ED Triage Vitals  Enc Vitals Group     BP 05/04/23 1344 123/77     Pulse Rate 05/04/23 1344 79     Resp 05/04/23 1344 18     Temp 05/04/23 1344 98.3 F (36.8 C)     Temp Source 05/04/23 1344 Oral     SpO2 05/04/23 1344 98 %     Weight --      Height --  Head Circumference --      Peak Flow --      Pain Score 05/04/23 1346 0     Pain Loc --      Pain Edu? --      Excl. in GC? --    No data found.  Updated Vital Signs BP 123/77 (BP Location: Left Arm)   Pulse 79   Temp 98.3 F (36.8 C) (Oral)   Resp 18   SpO2 98%   Visual Acuity Right Eye Distance:   Left Eye Distance:   Bilateral Distance:    Right Eye Near:   Left Eye Near:    Bilateral Near:     Physical Exam Vitals reviewed.  Constitutional:      Appearance: Normal appearance.  Musculoskeletal:       Feet:  Skin:    General: Skin is warm and dry.     Findings: Rash present.  Neurological:     General: No focal deficit present.     Mental Status: He is alert and oriented to person, place, and time.  Psychiatric:        Mood and Affect: Mood normal.        Behavior: Behavior normal.      UC Treatments / Results  Labs (all labs ordered are listed, but only abnormal results are displayed) Labs Reviewed - No  data to display  EKG   Radiology No results found.  Procedures Procedures (including critical care time)  Medications Ordered in UC Medications - No data to display  Initial Impression / Assessment and Plan / UC Course  I have reviewed the triage vital signs and the nursing notes.  Pertinent labs & imaging results that were available during my care of the patient were reviewed by me and considered in my medical decision making (see chart for details).   Ronald Mccarty is a 42 y.o. male presenting with rash. Patient is afebrile without recent antipyretics, satting well on room air. Overall is well appearing though non-toxic, well hydrated, without respiratory distress.   Rectangular area of erythema is present on the dorsal surface of the patient's midfoot.  There is a semicircular marking, hyperpigmented.  Additional hyperpigmented markings, each about 2 mm are distributed across the rectangular area.  Reviewed relevant chart history.  Unclear the cause of recurrence of symptoms initially, however suspect the current rash is related to the use of wound dressings.  The insect bite occurred over 1 month ago and it is unlikely that he is experiencing any adverse effects from that episode directly.  Necrosis at this late stage is unlikely.  There are no signs of infection.  The rectangular area of erythema is, I suspect, related to a reaction to the bandage, possibly adhesive.  Unclear the cause of the semicircular hyperpigmented line or other hyperpigmented markings.  However the entire area is dry and without discharge.  Recommended that he leave it on bandaged.  Also recommending treatment with topical steroids.  He has access to triamcinolone cream which is ideal for this purpose.  He will apply it twice daily.  Counseled patient on potential for adverse effects with medications prescribed/recommended today, ER and return-to-clinic precautions discussed, patient verbalized  understanding and agreement with care plan.  Final Clinical Impressions(s) / UC Diagnoses   Final diagnoses:  None   Discharge Instructions   None    ED Prescriptions   None    PDMP not reviewed this encounter.   Ronald Mccarty, [place] 05/04/23 [phone]  Ronald Mccarty, [place] 05/04/23 1404

## 2023-05-04 NOTE — ED Triage Notes (Addendum)
Pt reports having spider bite about a month ago to top of left foot and states the area has gotten larger (more redness).

## 2023-07-14 ENCOUNTER — Encounter: Payer: Self-pay | Admitting: Family Medicine

## 2023-07-14 ENCOUNTER — Other Ambulatory Visit: Payer: Self-pay | Admitting: Family Medicine

## 2023-07-14 DIAGNOSIS — F909 Attention-deficit hyperactivity disorder, unspecified type: Secondary | ICD-10-CM

## 2023-07-14 MED ORDER — AMPHETAMINE-DEXTROAMPHET ER 30 MG PO CP24: 30.0000 mg | ORAL_CAPSULE | ORAL | 0 refills | Status: DC

## 2023-07-15 ENCOUNTER — Ambulatory Visit (INDEPENDENT_AMBULATORY_CARE_PROVIDER_SITE_OTHER): Payer: BC Managed Care – PPO | Admitting: Family Medicine

## 2023-07-15 VITALS — BP 118/80 | HR 101 | Temp 98.6°F | Ht 69.0 in | Wt 230.4 lb

## 2023-07-15 DIAGNOSIS — F909 Attention-deficit hyperactivity disorder, unspecified type: Secondary | ICD-10-CM

## 2023-07-15 DIAGNOSIS — Z1159 Encounter for screening for other viral diseases: Secondary | ICD-10-CM | POA: Diagnosis not present

## 2023-07-15 DIAGNOSIS — E875 Hyperkalemia: Secondary | ICD-10-CM | POA: Diagnosis not present

## 2023-07-15 DIAGNOSIS — Z23 Encounter for immunization: Secondary | ICD-10-CM

## 2023-07-15 DIAGNOSIS — Z130 Encounter for screening for diseases of the blood and blood-forming organs and certain disorders involving the immune mechanism: Secondary | ICD-10-CM | POA: Diagnosis not present

## 2023-07-15 DIAGNOSIS — Z0001 Encounter for general adult medical examination with abnormal findings: Secondary | ICD-10-CM | POA: Diagnosis not present

## 2023-07-15 DIAGNOSIS — E785 Hyperlipidemia, unspecified: Secondary | ICD-10-CM | POA: Diagnosis not present

## 2023-07-15 DIAGNOSIS — Z0000 Encounter for general adult medical examination without abnormal findings: Secondary | ICD-10-CM

## 2023-07-15 MED ORDER — AMPHETAMINE-DEXTROAMPHET ER 30 MG PO CP24: 30.0000 mg | ORAL_CAPSULE | ORAL | 0 refills | Status: DC

## 2023-07-15 MED ORDER — AMPHETAMINE-DEXTROAMPHET ER 30 MG PO CP24
30.0000 mg | ORAL_CAPSULE | ORAL | 0 refills | Status: DC
Start: 2023-09-13 — End: 2023-09-10

## 2023-07-15 NOTE — Patient Instructions (Signed)
Labs today.  Continue your medications.  Follow up in 3 months.

## 2023-07-15 NOTE — Progress Notes (Unsigned)
Subjective:  Patient ID: Ronald Mccarty, male    DOB: 1981-07-07  Age: 42 y.o. MRN: [id number]  CC: No chief complaint on file.   HPI:  Patient Active Problem List   Diagnosis Date Noted   Hyperlipidemia 05/07/2021   GERD (gastroesophageal reflux disease) 12/25/2017   Adult ADHD 04/16/2017   Generalized anxiety disorder 04/16/2017    Social Hx   Social History   Socioeconomic History   Marital status: Married    Spouse name: Not on file   Number of children: Not on file   Years of education: Not on file   Highest education level: Bachelor's degree (e.g., BA, AB, BS)  Occupational History   Not on file  Tobacco Use   Smoking status: Former    Current packs/day: 1.00    Average packs/day: 1 pack/day for 20.0 years (20.0 ttl pk-yrs)    Types: Cigarettes    Start date: 04/27/2020   Smokeless tobacco: Never  Vaping Use   Vaping status: Never Used  Substance and Sexual Activity   Alcohol use: Not Currently    Comment: occ   Drug use: No   Sexual activity: Not on file  Other Topics Concern   Not on file  Social History Narrative   Not on file   Social Determinants of Health   Financial Resource Strain: Medium Risk (04/13/2023)   Overall Financial Resource Strain (CARDIA)    Difficulty of Paying Living Expenses: Somewhat hard  Food Insecurity: No Food Insecurity (04/13/2023)   Hunger Vital Sign    Worried About Running Out of Food in the Last Year: Never true    Ran Out of Food in the Last Year: Never true  Transportation Needs: No Transportation Needs (04/13/2023)   PRAPARE - Administrator, Civil Service (Medical): No    Lack of Transportation (Non-Medical): No  Physical Activity: Sufficiently Active (04/13/2023)   Exercise Vital Sign    Days of Exercise per Week: 5 days    Minutes of Exercise per Session: 60 min  Stress: Stress Concern Present (04/13/2023)   [employer] of Occupational Health - Occupational Stress Questionnaire    Feeling of  Stress : To some extent  Social Connections: Socially Isolated (04/13/2023)   Social Connection and Isolation Panel [NHANES]    Frequency of Communication with Friends and Family: Never    Frequency of Social Gatherings with Friends and Family: Never    Attends Religious Services: Never    Diplomatic Services operational officer: No    Attends Engineer, structural: Not on file    Marital Status: Married    Review of Systems Per HPI  Objective:  BP 118/80   Pulse (!) 101   Temp 98.6 F (37 C)   Ht 5\' 9"  (1.753 m)   Wt 230 lb 6.4 oz (104.5 kg)   SpO2 98%   BMI 34.02 kg/m      07/15/2023    1:30 PM 05/04/2023    1:44 PM 04/14/2023    1:06 PM  BP/Weight  Systolic BP 118 123 106  Diastolic BP 80 77 64  Wt. (Lbs) 230.4  226  BMI 34.02 kg/m2  33.37 kg/m2    Physical Exam  Lab Results  Component Value Date   WBC 5.8 04/10/2022   HGB 14.8 04/10/2022   HCT 45.4 04/10/2022   PLT 361 04/10/2022   GLUCOSE 105 (H) 04/10/2022   CHOL 210 (H) 04/10/2022   TRIG 81 04/10/2022  HDL 66 04/10/2022   LDLCALC 130 (H) 04/10/2022   ALT 19 04/10/2022   AST 25 04/10/2022   NA 139 04/10/2022   K 5.3 (H) 04/10/2022   CL 102 04/10/2022   CREATININE 1.13 04/10/2022   BUN 19 04/10/2022   CO2 23 04/10/2022   TSH 1.353 12/25/2017   INR 1.00 12/25/2017   HGBA1C 5.6 03/20/2021     Assessment & Plan:   Problem List Items Addressed This Visit   None   No orders of the defined types were placed in this encounter.   Follow-up:  No follow-ups on file.  Everlene Other DO [facility]

## 2023-07-16 DIAGNOSIS — Z0000 Encounter for general adult medical examination without abnormal findings: Secondary | ICD-10-CM | POA: Insufficient documentation

## 2023-07-16 NOTE — Assessment & Plan Note (Signed)
Doing well.  Screening labs today including hepatitis C.  Flu vaccine given.

## 2023-09-09 ENCOUNTER — Other Ambulatory Visit: Payer: Self-pay | Admitting: Family Medicine

## 2023-09-09 ENCOUNTER — Encounter: Payer: Self-pay | Admitting: Family Medicine

## 2023-09-10 ENCOUNTER — Other Ambulatory Visit: Payer: Self-pay | Admitting: Family Medicine

## 2023-09-10 DIAGNOSIS — F909 Attention-deficit hyperactivity disorder, unspecified type: Secondary | ICD-10-CM

## 2023-09-10 MED ORDER — AMPHETAMINE-DEXTROAMPHET ER 30 MG PO CP24
30.0000 mg | ORAL_CAPSULE | ORAL | 0 refills | Status: DC
Start: 2023-09-12 — End: 2023-10-14

## 2023-09-10 NOTE — Telephone Encounter (Signed)
Cook, Jayce G, DO   ? ?Rx sent.   ? ?

## 2023-10-14 ENCOUNTER — Ambulatory Visit: Payer: BC Managed Care – PPO | Admitting: Family Medicine

## 2023-10-14 DIAGNOSIS — F909 Attention-deficit hyperactivity disorder, unspecified type: Secondary | ICD-10-CM

## 2023-10-14 MED ORDER — AMPHETAMINE-DEXTROAMPHET ER 30 MG PO CP24
30.0000 mg | ORAL_CAPSULE | ORAL | 0 refills | Status: DC
Start: 2023-10-14 — End: 2024-01-09

## 2023-10-14 MED ORDER — AMPHETAMINE-DEXTROAMPHET ER 30 MG PO CP24
30.0000 mg | ORAL_CAPSULE | ORAL | 0 refills | Status: DC
Start: 2023-11-13 — End: 2024-01-09

## 2023-10-14 MED ORDER — AMPHETAMINE-DEXTROAMPHET ER 30 MG PO CP24
30.0000 mg | ORAL_CAPSULE | ORAL | 0 refills | Status: DC
Start: 2023-12-14 — End: 2024-01-09

## 2023-10-14 NOTE — Assessment & Plan Note (Signed)
 Stable.  Meds refilled.  Follow-up in 6 months.

## 2023-10-14 NOTE — Patient Instructions (Signed)
Follow up in 6 months.  Take care  Dr. Wren Gallaga  

## 2023-10-14 NOTE — Progress Notes (Signed)
Subjective:  Patient ID: Ronald Mccarty, male    DOB: August 01, 1981  Age: 42 y.o. MRN: [id number]  CC:   Chief Complaint  Patient presents with   ADHD    Follow up    HPI:  42 year old male presents for ADHD follow up.  Patient is doing well. Adderall is working well. No side effects. Needs refills.  No other complaints or concerns at this time.  Patient Active Problem List   Diagnosis Date Noted   Annual physical exam 07/16/2023   Hyperlipidemia 05/07/2021   GERD (gastroesophageal reflux disease) 12/25/2017   Adult ADHD 04/16/2017   Generalized anxiety disorder 04/16/2017    Social Hx   Social History   Socioeconomic History   Marital status: Married    Spouse name: Not on file   Number of children: Not on file   Years of education: Not on file   Highest education level: Bachelor's degree (e.g., BA, AB, BS)  Occupational History   Not on file  Tobacco Use   Smoking status: Former    Current packs/day: 1.00    Average packs/day: 1 pack/day for 20.0 years (20.0 ttl pk-yrs)    Types: Cigarettes    Start date: 04/27/2020   Smokeless tobacco: Never  Vaping Use   Vaping status: Never Used  Substance and Sexual Activity   Alcohol use: Not Currently    Comment: occ   Drug use: No   Sexual activity: Not on file  Other Topics Concern   Not on file  Social History Narrative   Not on file   Social Drivers of Health   Financial Resource Strain: Medium Risk (10/14/2023)   Overall Financial Resource Strain (CARDIA)    Difficulty of Paying Living Expenses: Somewhat hard  Food Insecurity: No Food Insecurity (10/14/2023)   Hunger Vital Sign    Worried About Running Out of Food in the Last Year: Never true    Ran Out of Food in the Last Year: Never true  Transportation Needs: No Transportation Needs (10/14/2023)   PRAPARE - Administrator, Civil Service (Medical): No    Lack of Transportation (Non-Medical): No  Physical Activity: Sufficiently Active  (10/14/2023)   Exercise Vital Sign    Days of Exercise per Week: 5 days    Minutes of Exercise per Session: 60 min  Stress: No Stress Concern Present (10/14/2023)   [employer] of Occupational Health - Occupational Stress Questionnaire    Feeling of Stress : Not at all  Social Connections: Socially Isolated (10/14/2023)   Social Connection and Isolation Panel [NHANES]    Frequency of Communication with Friends and Family: Once a week    Frequency of Social Gatherings with Friends and Family: Once a week    Attends Religious Services: Never    Database administrator or Organizations: No    Attends Engineer, structural: Not on file    Marital Status: Married    Review of Systems Per HPI  Objective:  BP 126/85   Pulse 73   Temp 97.7 F (36.5 C)   Ht 5\' 9"  (1.753 m)   Wt 232 lb (105.2 kg)   SpO2 99%   BMI 34.26 kg/m      10/14/2023    2:48 PM 07/15/2023    1:30 PM 05/04/2023    1:44 PM  BP/Weight  Systolic BP 126 118 123  Diastolic BP 85 80 77  Wt. (Lbs) 232 230.4   BMI 34.26 kg/m2  34.02 kg/m2     Physical Exam Vitals and nursing note reviewed.  Constitutional:      General: He is not in acute distress.    Appearance: Normal appearance.  HENT:     Head: Normocephalic and atraumatic.  Eyes:     General:        Right eye: No discharge.        Left eye: No discharge.     Conjunctiva/sclera: Conjunctivae normal.  Cardiovascular:     Rate and Rhythm: Normal rate and regular rhythm.  Pulmonary:     Effort: Pulmonary effort is normal.     Breath sounds: Normal breath sounds. No wheezing, rhonchi or rales.  Neurological:     Mental Status: He is alert.  Psychiatric:        Mood and Affect: Mood normal.        Behavior: Behavior normal.     Lab Results  Component Value Date   WBC 7.6 07/15/2023   HGB 14.7 07/15/2023   HCT 45.7 07/15/2023   PLT 305 07/15/2023   GLUCOSE 88 07/15/2023   CHOL 234 (H) 07/15/2023   TRIG 105 07/15/2023   HDL 72  07/15/2023   LDLCALC 144 (H) 07/15/2023   ALT 23 07/15/2023   AST 26 07/15/2023   NA 143 07/15/2023   K 5.0 07/15/2023   CL 104 07/15/2023   CREATININE 1.04 07/15/2023   BUN 12 07/15/2023   CO2 23 07/15/2023   TSH 1.353 12/25/2017   INR 1.00 12/25/2017   HGBA1C 5.6 03/20/2021     Assessment & Plan:   Problem List Items Addressed This Visit       Other   Adult ADHD   Stable. Meds refilled. Follow up in 6 months.      Relevant Medications   amphetamine-dextroamphetamine (ADDERALL XR) 30 MG 24 hr capsule   amphetamine-dextroamphetamine (ADDERALL XR) 30 MG 24 hr capsule (Start on 11/13/2023)   amphetamine-dextroamphetamine (ADDERALL XR) 30 MG 24 hr capsule (Start on 12/14/2023)    Meds ordered this encounter  Medications   amphetamine-dextroamphetamine (ADDERALL XR) 30 MG 24 hr capsule    Sig: Take 1 capsule (30 mg total) by mouth every morning.    Dispense:  30 capsule    Refill:  0   amphetamine-dextroamphetamine (ADDERALL XR) 30 MG 24 hr capsule    Sig: Take 1 capsule (30 mg total) by mouth every morning.    Dispense:  30 capsule    Refill:  0   amphetamine-dextroamphetamine (ADDERALL XR) 30 MG 24 hr capsule    Sig: Take 1 capsule (30 mg total) by mouth every morning.    Dispense:  30 capsule    Refill:  0    Follow-up:  Return in about 6 months (around 04/13/2024).  Everlene Other DO [facility]

## 2024-01-08 ENCOUNTER — Encounter: Payer: Self-pay | Admitting: Family Medicine

## 2024-01-09 ENCOUNTER — Other Ambulatory Visit: Payer: Self-pay | Admitting: Family Medicine

## 2024-01-09 DIAGNOSIS — K219 Gastro-esophageal reflux disease without esophagitis: Secondary | ICD-10-CM

## 2024-01-09 DIAGNOSIS — F909 Attention-deficit hyperactivity disorder, unspecified type: Secondary | ICD-10-CM

## 2024-01-09 MED ORDER — AMPHETAMINE-DEXTROAMPHET ER 30 MG PO CP24
30.0000 mg | ORAL_CAPSULE | ORAL | 0 refills | Status: AC
Start: 2024-02-09 — End: ?

## 2024-01-09 MED ORDER — AMPHETAMINE-DEXTROAMPHET ER 30 MG PO CP24
30.0000 mg | ORAL_CAPSULE | ORAL | 0 refills | Status: AC
Start: 2024-03-10 — End: ?

## 2024-01-09 MED ORDER — AMPHETAMINE-DEXTROAMPHET ER 30 MG PO CP24
30.0000 mg | ORAL_CAPSULE | ORAL | 0 refills | Status: AC
Start: 2024-01-09 — End: ?

## 2024-03-25 ENCOUNTER — Ambulatory Visit: Admitting: Family Medicine

## 2024-03-25 ENCOUNTER — Ambulatory Visit: Payer: Self-pay | Admitting: Family Medicine

## 2024-03-25 ENCOUNTER — Ambulatory Visit (HOSPITAL_COMMUNITY)
Admission: RE | Admit: 2024-03-25 | Discharge: 2024-03-25 | Disposition: A | Discharge status: Home / Self Care | Source: Ambulatory Visit | Attending: Family Medicine | Admitting: Family Medicine

## 2024-03-25 VITALS — BP 116/82 | HR 85 | Temp 97.7°F | Ht 69.0 in | Wt 221.0 lb

## 2024-03-25 DIAGNOSIS — R0789 Other chest pain: Secondary | ICD-10-CM | POA: Diagnosis present

## 2024-03-25 NOTE — Patient Instructions (Signed)
 Xray for further evaluation.  We will call with results.

## 2024-03-26 ENCOUNTER — Other Ambulatory Visit: Payer: Self-pay | Admitting: Family Medicine

## 2024-03-26 ENCOUNTER — Encounter: Payer: Self-pay | Admitting: Family Medicine

## 2024-03-26 ENCOUNTER — Ambulatory Visit (HOSPITAL_COMMUNITY)
Admission: RE | Admit: 2024-03-26 | Discharge: 2024-03-26 | Disposition: A | Discharge status: Home / Self Care | Source: Ambulatory Visit | Attending: Family Medicine | Admitting: Family Medicine

## 2024-03-26 DIAGNOSIS — R222 Localized swelling, mass and lump, trunk: Secondary | ICD-10-CM | POA: Insufficient documentation

## 2024-03-26 DIAGNOSIS — R0789 Other chest pain: Secondary | ICD-10-CM | POA: Insufficient documentation

## 2024-03-26 NOTE — Progress Notes (Signed)
 Subjective:  Patient ID: Ronald Mccarty, male    DOB: 08-14-1981  Age: 43 y.o. MRN: [id number]  CC:   Chief Complaint  Patient presents with   lump on left side of the chest     Noticed last wed when seatbelt irritated area     HPI:  43 year old male presents for evaluation of the above.  Patient has noticed an area of concern to the left upper chest last week.  He states that he noticed it after wearing his seatbelt.  Areas just to the left of the upper sternum.  Raised and full.  No pain.  No fall, trauma, injury.  No relieving factors.  No other complaints.  Patient Active Problem List   Diagnosis Date Noted   Chest fullness 03/26/2024   Hyperlipidemia 05/07/2021   GERD (gastroesophageal reflux disease) 12/25/2017   Adult ADHD 04/16/2017   Generalized anxiety disorder 04/16/2017    Social Hx   Social History   Socioeconomic History   Marital status: Married    Spouse name: Not on file   Number of children: Not on file   Years of education: Not on file   Highest education level: Bachelor's degree (e.g., BA, AB, BS)  Occupational History   Not on file  Tobacco Use   Smoking status: Former    Current packs/day: 1.00    Average packs/day: 1 pack/day for 20.0 years (20.0 ttl pk-yrs)    Types: Cigarettes    Start date: 04/27/2020   Smokeless tobacco: Never  Vaping Use   Vaping status: Never Used  Substance and Sexual Activity   Alcohol use: Not Currently    Comment: occ   Drug use: No   Sexual activity: Not on file  Other Topics Concern   Not on file  Social History Narrative   Not on file   Social Drivers of Health   Financial Resource Strain: Medium Risk (10/14/2023)   Overall Financial Resource Strain (CARDIA)    Difficulty of Paying Living Expenses: Somewhat hard  Food Insecurity: No Food Insecurity (10/14/2023)   Hunger Vital Sign    Worried About Running Out of Food in the Last Year: Never true    Ran Out of Food in the Last Year: Never true   Transportation Needs: No Transportation Needs (10/14/2023)   PRAPARE - Administrator, Civil Service (Medical): No    Lack of Transportation (Non-Medical): No  Physical Activity: Sufficiently Active (10/14/2023)   Exercise Vital Sign    Days of Exercise per Week: 5 days    Minutes of Exercise per Session: 60 min  Stress: No Stress Concern Present (10/14/2023)   [employer] of Occupational Health - Occupational Stress Questionnaire    Feeling of Stress : Not at all  Social Connections: Socially Isolated (10/14/2023)   Social Connection and Isolation Panel [NHANES]    Frequency of Communication with Friends and Family: Once a week    Frequency of Social Gatherings with Friends and Family: Once a week    Attends Religious Services: Never    Database administrator or Organizations: No    Attends Engineer, structural: Not on file    Marital Status: Married    Review of Systems Per HPI  Objective:  BP 116/82   Pulse 85   Temp 97.7 F (36.5 C)   Ht 5\' 9"  (1.753 m)   Wt 221 lb (100.2 kg)   SpO2 98%   BMI 32.64 kg/m  03/25/2024    1:41 PM 10/14/2023    2:48 PM 07/15/2023    1:30 PM  BP/Weight  Systolic BP 116 126 118  Diastolic BP 82 85 80  Wt. (Lbs) 221 232 230.4  BMI 32.64 kg/m2 34.26 kg/m2 34.02 kg/m2    Physical Exam Vitals and nursing note reviewed.  Constitutional:      General: He is not in acute distress.    Appearance: Normal appearance.  HENT:     Head: Normocephalic and atraumatic.  Cardiovascular:     Rate and Rhythm: Normal rate and regular rhythm.  Pulmonary:     Effort: Pulmonary effort is normal.     Breath sounds: Normal breath sounds.  Chest:       Comments: Labor location is raised/elevated compared to the right side.  Nontender. Neurological:     Mental Status: He is alert.     Lab Results  Component Value Date   WBC 7.6 07/15/2023   HGB 14.7 07/15/2023   HCT 45.7 07/15/2023   PLT 305 07/15/2023    GLUCOSE 88 07/15/2023   CHOL 234 (H) 07/15/2023   TRIG 105 07/15/2023   HDL 72 07/15/2023   LDLCALC 144 (H) 07/15/2023   ALT 23 07/15/2023   AST 26 07/15/2023   NA 143 07/15/2023   K 5.0 07/15/2023   CL 104 07/15/2023   CREATININE 1.04 07/15/2023   BUN 12 07/15/2023   CO2 23 07/15/2023   TSH 1.353 12/25/2017   INR 1.00 12/25/2017   HGBA1C 5.6 03/20/2021     Assessment & Plan:  Chest fullness Assessment & Plan: Etiology unclear.  Chest x-ray for further evaluation.  **Chest x-ray returned after the visit.  Was independently reviewed by me and was negative.  Consider chest CT.  Orders: -     DG Chest 2 View    Follow-up:  Return if symptoms worsen or fail to improve.  Kathleen Papa DO [facility]

## 2024-03-26 NOTE — Assessment & Plan Note (Signed)
 Etiology unclear.  Chest x-ray for further evaluation.  **Chest x-ray returned after the visit.  Was independently reviewed by me and was negative.  Consider chest CT.

## 2024-03-28 ENCOUNTER — Ambulatory Visit: Payer: Self-pay | Admitting: Family Medicine

## 2024-04-13 ENCOUNTER — Ambulatory Visit: Payer: BC Managed Care – PPO | Admitting: Family Medicine

## 2024-04-13 VITALS — BP 121/79 | HR 73 | Temp 98.6°F | Wt 223.2 lb

## 2024-04-13 DIAGNOSIS — F909 Attention-deficit hyperactivity disorder, unspecified type: Secondary | ICD-10-CM | POA: Diagnosis not present

## 2024-04-13 DIAGNOSIS — M7660 Achilles tendinitis, unspecified leg: Secondary | ICD-10-CM | POA: Diagnosis not present

## 2024-04-13 MED ORDER — AMPHETAMINE-DEXTROAMPHET ER 30 MG PO CP24: 30.0000 mg | ORAL_CAPSULE | ORAL | 0 refills | Status: DC

## 2024-04-13 MED ORDER — AMPHETAMINE-DEXTROAMPHET ER 30 MG PO CP24
30.0000 mg | ORAL_CAPSULE | ORAL | 0 refills | Status: DC
Start: 2024-06-13 — End: 2024-06-11

## 2024-04-13 NOTE — Patient Instructions (Signed)
 Medication as prescribed.  Rest. Exercise.  Follow up in 6 months.

## 2024-04-13 NOTE — Progress Notes (Signed)
 Subjective:  Patient ID: Ronald Mccarty, male    DOB: Dec 06, 1980  Age: 43 y.o. MRN: [id number]  CC:  Follow up   HPI:  43 year old male presents for follow-up.  ADHD stable.  Needs refill on his medication.  Patient also reports that he noticed a lump on his left Achilles yesterday.  He has recently been to the beach and has also recently been running.  He is very concerned about this.  Patient Active Problem List   Diagnosis Date Noted   Non-insertional Achilles tendinopathy 04/13/2024   Chest fullness 03/26/2024   Hyperlipidemia 05/07/2021   GERD (gastroesophageal reflux disease) 12/25/2017   Adult ADHD 04/16/2017   Generalized anxiety disorder 04/16/2017    Social Hx   Social History   Socioeconomic History   Marital status: Married    Spouse name: Not on file   Number of children: Not on file   Years of education: Not on file   Highest education level: Bachelor's degree (e.g., BA, AB, BS)  Occupational History   Not on file  Tobacco Use   Smoking status: Former    Current packs/day: 1.00    Average packs/day: 1 pack/day for 20.0 years (20.0 ttl pk-yrs)    Types: Cigarettes    Start date: 04/27/2020   Smokeless tobacco: Never  Vaping Use   Vaping status: Never Used  Substance and Sexual Activity   Alcohol use: Not Currently    Comment: occ   Drug use: No   Sexual activity: Not on file  Other Topics Concern   Not on file  Social History Narrative   Not on file   Social Drivers of Health   Financial Resource Strain: Medium Risk (04/12/2024)   Overall Financial Resource Strain (CARDIA)    Difficulty of Paying Living Expenses: Somewhat hard  Food Insecurity: No Food Insecurity (04/12/2024)   Hunger Vital Sign    Worried About Running Out of Food in the Last Year: Never true    Ran Out of Food in the Last Year: Never true  Transportation Needs: No Transportation Needs (04/12/2024)   PRAPARE - Administrator, Civil Service (Medical): No    Lack  of Transportation (Non-Medical): No  Physical Activity: Sufficiently Active (04/12/2024)   Exercise Vital Sign    Days of Exercise per Week: 5 days    Minutes of Exercise per Session: 40 min  Stress: Stress Concern Present (04/12/2024)   [employer] of Occupational Health - Occupational Stress Questionnaire    Feeling of Stress: To some extent  Social Connections: Socially Isolated (04/12/2024)   Social Connection and Isolation Panel    Frequency of Communication with Friends and Family: Once a week    Frequency of Social Gatherings with Friends and Family: Once a week    Attends Religious Services: Never    Database administrator or Organizations: No    Attends Engineer, structural: Not on file    Marital Status: Married    Review of Systems Per HPI  Objective:  BP 121/79   Pulse 73   Temp 98.6 F (37 C)   Wt 223 lb 3.2 oz (101.2 kg)   SpO2 99%   BMI 32.96 kg/m      04/13/2024    1:31 PM 03/25/2024    1:41 PM 10/14/2023    2:48 PM  BP/Weight  Systolic BP 121 116 126  Diastolic BP 79 82 85  Wt. (Lbs) 223.2 221 232  BMI 32.96 kg/m2  32.64 kg/m2 34.26 kg/m2    Physical Exam Vitals and nursing note reviewed.  Constitutional:      General: He is not in acute distress.    Appearance: Normal appearance.  HENT:     Head: Normocephalic and atraumatic.   Cardiovascular:     Rate and Rhythm: Normal rate and regular rhythm.   Musculoskeletal:     Comments: Patient has a firm palpable nodular area at the distal Achilles above the attachment site to the calcaneus.    Neurological:     Mental Status: He is alert.   Psychiatric:     Comments: Anxious.     Lab Results  Component Value Date   WBC 7.6 07/15/2023   HGB 14.7 07/15/2023   HCT 45.7 07/15/2023   PLT 305 07/15/2023   GLUCOSE 88 07/15/2023   CHOL 234 (H) 07/15/2023   TRIG 105 07/15/2023   HDL 72 07/15/2023   LDLCALC 144 (H) 07/15/2023   ALT 23 07/15/2023   AST 26 07/15/2023   NA 143  07/15/2023   K 5.0 07/15/2023   CL 104 07/15/2023   CREATININE 1.04 07/15/2023   BUN 12 07/15/2023   CO2 23 07/15/2023   TSH 1.353 12/25/2017   INR 1.00 12/25/2017   HGBA1C 5.6 03/20/2021     Assessment & Plan:  Adult ADHD Assessment & Plan: Stable.  Meds refilled.  Orders: -     Amphetamine -Dextroamphet ER; Take 1 capsule (30 mg total) by mouth every morning.  Dispense: 30 capsule; Refill: 0 -     Amphetamine -Dextroamphet ER; Take 1 capsule (30 mg total) by mouth every morning.  Dispense: 30 capsule; Refill: 0 -     Amphetamine -Dextroamphet ER; Take 1 capsule (30 mg total) by mouth every morning.  Dispense: 30 capsule; Refill: 0  Non-insertional Achilles tendinopathy Assessment & Plan: Advise rest, ice, and supportive care.  Exercises given.     Follow-up:  6 months  Burlie Cajamarca Debrah Fan DO [facility]

## 2024-04-13 NOTE — Assessment & Plan Note (Signed)
 Advise rest, ice, and supportive care.  Exercises given.

## 2024-04-13 NOTE — Assessment & Plan Note (Signed)
Stable.  Meds refilled. 

## 2024-06-09 ENCOUNTER — Encounter: Payer: Self-pay | Admitting: Family Medicine

## 2024-06-11 ENCOUNTER — Other Ambulatory Visit: Payer: Self-pay | Admitting: Family Medicine

## 2024-06-11 DIAGNOSIS — F909 Attention-deficit hyperactivity disorder, unspecified type: Secondary | ICD-10-CM

## 2024-06-11 MED ORDER — AMPHETAMINE-DEXTROAMPHET ER 30 MG PO CP24: 30.0000 mg | ORAL_CAPSULE | ORAL | 0 refills | Status: DC

## 2024-07-09 ENCOUNTER — Encounter: Payer: Self-pay | Admitting: Family Medicine

## 2024-07-12 ENCOUNTER — Other Ambulatory Visit: Payer: Self-pay | Admitting: Family Medicine

## 2024-07-12 DIAGNOSIS — F909 Attention-deficit hyperactivity disorder, unspecified type: Secondary | ICD-10-CM

## 2024-07-12 MED ORDER — AMPHETAMINE-DEXTROAMPHET ER 30 MG PO CP24
30.0000 mg | ORAL_CAPSULE | ORAL | 0 refills | Status: DC
Start: 2024-07-13 — End: 2024-09-10

## 2024-07-12 MED ORDER — AMPHETAMINE-DEXTROAMPHET ER 30 MG PO CP24
30.0000 mg | ORAL_CAPSULE | ORAL | 0 refills | Status: DC
Start: 2024-09-12 — End: 2024-09-10

## 2024-07-12 MED ORDER — AMPHETAMINE-DEXTROAMPHET ER 30 MG PO CP24: 30.0000 mg | ORAL_CAPSULE | ORAL | 0 refills | Status: DC

## 2024-07-13 ENCOUNTER — Other Ambulatory Visit: Payer: Self-pay | Admitting: Family Medicine

## 2024-07-13 DIAGNOSIS — K219 Gastro-esophageal reflux disease without esophagitis: Secondary | ICD-10-CM

## 2024-07-19 ENCOUNTER — Encounter: Payer: Self-pay | Admitting: Family Medicine

## 2024-07-19 ENCOUNTER — Ambulatory Visit: Admitting: Family Medicine

## 2024-07-19 VITALS — BP 125/82 | HR 81 | Ht 69.0 in | Wt 217.0 lb

## 2024-07-19 DIAGNOSIS — Z13220 Encounter for screening for lipoid disorders: Secondary | ICD-10-CM

## 2024-07-19 DIAGNOSIS — Z0000 Encounter for general adult medical examination without abnormal findings: Secondary | ICD-10-CM | POA: Diagnosis not present

## 2024-07-19 DIAGNOSIS — Z23 Encounter for immunization: Secondary | ICD-10-CM | POA: Diagnosis not present

## 2024-07-19 DIAGNOSIS — Z13228 Encounter for screening for other metabolic disorders: Secondary | ICD-10-CM

## 2024-07-19 DIAGNOSIS — Z130 Encounter for screening for diseases of the blood and blood-forming organs and certain disorders involving the immune mechanism: Secondary | ICD-10-CM

## 2024-07-19 NOTE — Patient Instructions (Addendum)
 Labs at your convenience.  Take care  Dr. Bluford

## 2024-07-20 ENCOUNTER — Ambulatory Visit: Payer: Self-pay | Admitting: Family Medicine

## 2024-07-20 DIAGNOSIS — Z0000 Encounter for general adult medical examination without abnormal findings: Secondary | ICD-10-CM | POA: Insufficient documentation

## 2024-07-20 LAB — LIPID PANEL
Chol/HDL Ratio: 2.7 ratio (ref 0.0–5.0)
Cholesterol, Total: 210 mg/dL — ABNORMAL HIGH (ref 100–199)
HDL: 77 mg/dL (ref >39)
LDL Chol Calc (NIH): 122 mg/dL — ABNORMAL HIGH (ref 0–99)
Triglycerides: 59 mg/dL (ref 0–149)
VLDL Cholesterol Cal: 11 mg/dL (ref 5–40)

## 2024-07-20 LAB — CBC
Hematocrit: 44.2 % (ref 37.5–51.0)
Hemoglobin: 14.3 g/dL (ref 13.0–17.7)
MCH: 28.4 pg (ref 26.6–33.0)
MCHC: 32.4 g/dL (ref 31.5–35.7)
MCV: 88 fL (ref 79–97)
Platelets: 316 x10E3/uL (ref 150–450)
RBC: 5.03 x10E6/uL (ref 4.14–5.80)
RDW: 13.1 % (ref 11.6–15.4)
WBC: 7.6 x10E3/uL (ref 3.4–10.8)

## 2024-07-20 LAB — CMP14+EGFR
ALT: 20 IU/L (ref 0–44)
AST: 22 IU/L (ref 0–40)
Albumin: 4.7 g/dL (ref 4.1–5.1)
Alkaline Phosphatase: 58 IU/L (ref 47–123)
BUN/Creatinine Ratio: 17 (ref 9–20)
BUN: 18 mg/dL (ref 6–24)
Bilirubin Total: 0.7 mg/dL (ref 0.0–1.2)
CO2: 21 mmol/L (ref 20–29)
Calcium: 9.4 mg/dL (ref 8.7–10.2)
Chloride: 104 mmol/L (ref 96–106)
Creatinine, Ser: 1.09 mg/dL (ref 0.76–1.27)
Globulin, Total: 2.3 g/dL (ref 1.5–4.5)
Glucose: 81 mg/dL (ref 70–99)
Potassium: 4.4 mmol/L (ref 3.5–5.2)
Sodium: 140 mmol/L (ref 134–144)
Total Protein: 7 g/dL (ref 6.0–8.5)
eGFR: 86 mL/min/1.73 (ref >59)

## 2024-07-20 NOTE — Assessment & Plan Note (Signed)
 Flu vaccine given today. Labs ordered. Health maintenance section updated.

## 2024-07-20 NOTE — Progress Notes (Signed)
 Subjective:  Patient ID: Ronald Mccarty, male    DOB: 12-Aug-1981  Age: 43 y.o. MRN: [id number]  CC:   Chief Complaint  Patient presents with   Annual Exam    Recheck spot on chest    HPI:  43 year old male presents for an annual exam.  Patient is doing well.   Amendable to flu vaccine today. Tetanus up to date. Needs routine labs.    No chest pain or SOB.  Patient Active Problem List   Diagnosis Date Noted   Annual physical exam 07/20/2024   Non-insertional Achilles tendinopathy 04/13/2024   Hyperlipidemia 05/07/2021   GERD (gastroesophageal reflux disease) 12/25/2017   Adult ADHD 04/16/2017   Generalized anxiety disorder 04/16/2017    Social Hx   Social History   Socioeconomic History   Marital status: Married    Spouse name: Not on file   Number of children: Not on file   Years of education: Not on file   Highest education level: Bachelor's degree (e.g., BA, AB, BS)  Occupational History   Not on file  Tobacco Use   Smoking status: Former    Current packs/day: 1.00    Average packs/day: 1 pack/day for 20.0 years (20.0 ttl pk-yrs)    Types: Cigarettes    Start date: 04/27/2020   Smokeless tobacco: Never  Vaping Use   Vaping status: Never Used  Substance and Sexual Activity   Alcohol use: Not Currently    Comment: occ   Drug use: No   Sexual activity: Not on file  Other Topics Concern   Not on file  Social History Narrative   Not on file   Social Drivers of Health   Financial Resource Strain: Medium Risk (04/12/2024)   Overall Financial Resource Strain (CARDIA)    Difficulty of Paying Living Expenses: Somewhat hard  Food Insecurity: No Food Insecurity (04/12/2024)   Hunger Vital Sign    Worried About Running Out of Food in the Last Year: Never true    Ran Out of Food in the Last Year: Never true  Transportation Needs: No Transportation Needs (04/12/2024)   PRAPARE - Administrator, Civil Service (Medical): No    Lack of Transportation  (Non-Medical): No  Physical Activity: Sufficiently Active (04/12/2024)   Exercise Vital Sign    Days of Exercise per Week: 5 days    Minutes of Exercise per Session: 40 min  Stress: Stress Concern Present (04/12/2024)   [employer] of Occupational Health - Occupational Stress Questionnaire    Feeling of Stress: To some extent  Social Connections: Socially Isolated (04/12/2024)   Social Connection and Isolation Panel    Frequency of Communication with Friends and Family: Once a week    Frequency of Social Gatherings with Friends and Family: Once a week    Attends Religious Services: Never    Database administrator or Organizations: No    Attends Engineer, structural: Not on file    Marital Status: Married    Review of Systems Per HPI  Objective:  BP 125/82   Pulse 81   Ht 5' 9 (1.753 m)   Wt 217 lb (98.4 kg)   SpO2 100%   BMI 32.05 kg/m      07/19/2024    1:10 PM 04/13/2024    1:31 PM 03/25/2024    1:41 PM  BP/Weight  Systolic BP 125 121 116  Diastolic BP 82 79 82  Wt. (Lbs) 217 223.2 221  BMI 32.05 kg/m2 32.96 kg/m2 32.64 kg/m2    Physical Exam Vitals and nursing note reviewed.  Constitutional:      General: He is not in acute distress.    Appearance: Normal appearance.  HENT:     Head: Normocephalic and atraumatic.  Eyes:     General:        Right eye: No discharge.        Left eye: No discharge.     Conjunctiva/sclera: Conjunctivae normal.  Cardiovascular:     Rate and Rhythm: Normal rate and regular rhythm.  Pulmonary:     Effort: Pulmonary effort is normal.     Breath sounds: Normal breath sounds. No wheezing, rhonchi or rales.  Neurological:     Mental Status: He is alert.  Psychiatric:        Mood and Affect: Mood normal.        Behavior: Behavior normal.     Lab Results  Component Value Date   WBC 7.6 07/19/2024   HGB 14.3 07/19/2024   HCT 44.2 07/19/2024   PLT 316 07/19/2024   GLUCOSE 81 07/19/2024   CHOL 210 (H) 07/19/2024    TRIG 59 07/19/2024   HDL 77 07/19/2024   LDLCALC 122 (H) 07/19/2024   ALT 20 07/19/2024   AST 22 07/19/2024   NA 140 07/19/2024   K 4.4 07/19/2024   CL 104 07/19/2024   CREATININE 1.09 07/19/2024   BUN 18 07/19/2024   CO2 21 07/19/2024   TSH 1.353 12/25/2017   INR 1.00 12/25/2017   HGBA1C 5.6 03/20/2021     Assessment & Plan:  Annual physical exam Assessment & Plan: Flu vaccine given today. Labs ordered. Health maintenance section updated.   Influenza vaccine needed -     Flu vaccine trivalent PF, 6mos and older(Flulaval,Afluria,Fluarix,Fluzone)  Screening for deficiency anemia -     CBC  Screening for metabolic disorder -     CMP14+EGFR  Screening, lipid -     Lipid panel    Jacqulyn Ahle DO [facility]

## 2024-09-10 ENCOUNTER — Encounter: Payer: Self-pay | Admitting: Family Medicine

## 2024-09-10 ENCOUNTER — Other Ambulatory Visit: Payer: Self-pay | Admitting: Family Medicine

## 2024-09-10 DIAGNOSIS — F909 Attention-deficit hyperactivity disorder, unspecified type: Secondary | ICD-10-CM

## 2024-09-10 MED ORDER — AMPHETAMINE-DEXTROAMPHET ER 30 MG PO CP24: 30.0000 mg | ORAL_CAPSULE | ORAL | 0 refills | Status: DC

## 2024-09-13 NOTE — Telephone Encounter (Unsigned)
 Copied from CRM #[id number]. Topic: Clinical - Prescription Issue >> Sep 13, 2024  9:13 AM Kevelyn M wrote: Reason for CRM: Pharmacy is out of amphetamine -dextroamphetamine  (ADDERALL  XR) 30 MG 24 hr capsule and patient would like a paper script. He would like to pick this up today if at all possible.  Call back: [phone]

## 2024-10-13 ENCOUNTER — Ambulatory Visit: Admitting: Family Medicine

## 2024-10-13 ENCOUNTER — Other Ambulatory Visit: Payer: Self-pay

## 2024-10-13 ENCOUNTER — Telehealth: Payer: Self-pay

## 2024-10-13 DIAGNOSIS — F909 Attention-deficit hyperactivity disorder, unspecified type: Secondary | ICD-10-CM | POA: Diagnosis not present

## 2024-10-13 MED ORDER — AMPHETAMINE-DEXTROAMPHET ER 30 MG PO CP24
30.0000 mg | ORAL_CAPSULE | ORAL | 0 refills | Status: AC
  Filled 2024-11-13: qty 30, 30d supply, fill #0

## 2024-10-13 MED ORDER — AMPHETAMINE-DEXTROAMPHET ER 30 MG PO CP24
30.0000 mg | ORAL_CAPSULE | ORAL | 0 refills | Status: AC
  Filled 2024-10-13: qty 30, 30d supply, fill #0

## 2024-10-13 MED ORDER — AMPHETAMINE-DEXTROAMPHET ER 30 MG PO CP24: 30.0000 mg | ORAL_CAPSULE | ORAL | 0 refills | Status: DC

## 2024-10-13 MED ORDER — AMPHETAMINE-DEXTROAMPHET ER 30 MG PO CP24: 30.0000 mg | ORAL_CAPSULE | ORAL | 0 refills | Status: AC

## 2024-10-13 NOTE — Assessment & Plan Note (Signed)
Stable.  Meds refilled. 

## 2024-10-13 NOTE — Telephone Encounter (Signed)
 Copied from CRM #[id number]. Topic: Clinical - Prescription Issue >> Oct 13, 2024  4:02 PM Thersia BROCKS wrote: Reason for CRM: Patient called in regarding amphetamine -dextroamphetamine  (ADDERALL  XR) 30 MG 24 hr capsule . Patient called in stated he is at the pharmacy and they say they have not received prescription

## 2024-10-13 NOTE — Patient Instructions (Signed)
 Meds sent in.  Follow up in 3-6 months.  Take care  Dr. Bluford

## 2024-10-13 NOTE — Progress Notes (Signed)
 Subjective:  Patient ID: Ronald Mccarty, male    DOB: 07-Oct-1981  Age: 43 y.o. MRN: [id number]  CC:   Chief Complaint  Patient presents with   ADHD    Follow up - requesting written prescription due to trouble w/ availability    HPI:  43 year old male presents for follow up.  Doing well. ADHD is stable. Needs med refills.  No other complaints or concerns at this time.  Patient Active Problem List   Diagnosis Date Noted   Annual physical exam 07/20/2024   Non-insertional Achilles tendinopathy 04/13/2024   Hyperlipidemia 05/07/2021   GERD (gastroesophageal reflux disease) 12/25/2017   Adult ADHD 04/16/2017   Generalized anxiety disorder 04/16/2017    Social Hx   Social History   Socioeconomic History   Marital status: Married    Spouse name: Not on file   Number of children: Not on file   Years of education: Not on file   Highest education level: Bachelor's degree (e.g., BA, AB, BS)  Occupational History   Not on file  Tobacco Use   Smoking status: Former    Current packs/day: 1.00    Average packs/day: 1 pack/day for 20.3 years (20.3 ttl pk-yrs)    Types: Cigarettes    Start date: 04/27/2020   Smokeless tobacco: Never  Vaping Use   Vaping status: Never Used  Substance and Sexual Activity   Alcohol use: Not Currently    Comment: occ   Drug use: No   Sexual activity: Not on file  Other Topics Concern   Not on file  Social History Narrative   Not on file   Social Drivers of Health   Tobacco Use: Medium Risk (10/13/2024)   Patient History    Smoking Tobacco Use: Former    Smokeless Tobacco Use: Never    Passive Exposure: Not on file  Financial Resource Strain: Medium Risk (10/13/2024)   Overall Financial Resource Strain (CARDIA)    Difficulty of Paying Living Expenses: Somewhat hard  Food Insecurity: No Food Insecurity (10/13/2024)   Epic    Worried About Radiation Protection Practitioner of Food in the Last Year: Never true    Ran Out of Food in the Last Year: Never  true  Transportation Needs: No Transportation Needs (10/13/2024)   Epic    Lack of Transportation (Medical): No    Lack of Transportation (Non-Medical): No  Physical Activity: Sufficiently Active (10/13/2024)   Exercise Vital Sign    Days of Exercise per Week: 6 days    Minutes of Exercise per Session: 60 min  Stress: No Stress Concern Present (10/13/2024)   [employer] of Occupational Health - Occupational Stress Questionnaire    Feeling of Stress: Not at all  Social Connections: Moderately Isolated (10/13/2024)   Social Connection and Isolation Panel    Frequency of Communication with Friends and Family: Once a week    Frequency of Social Gatherings with Friends and Family: Once a week    Attends Religious Services: 1 to 4 times per year    Active Member of Clubs or Organizations: No    Attends Banker Meetings: Not on file    Marital Status: Married  Depression (PHQ2-9): Low Risk (10/13/2024)   Depression (PHQ2-9)    PHQ-2 Score: 0  Alcohol Screen: Low Risk (10/13/2024)   Alcohol Screen    Last Alcohol Screening Score (AUDIT): 2  Housing: Low Risk (10/13/2024)   Epic    Unable to Pay for Housing in the Last  Year: No    Number of Times Moved in the Last Year: 0    Homeless in the Last Year: No  Utilities: Not At Risk (08/18/2024)   Received from [facility]   Epic    In the past 12 months has the electric, gas, oil, or water company threatened to shut off services in your home?: No  Health Literacy: Not on file    Review of Systems Per HPI  Objective:  BP 132/88   Pulse 64   Temp 98.2 F (36.8 C)   Ht 5' 9 (1.753 m)   Wt 221 lb (100.2 kg)   SpO2 98%   BMI 32.64 kg/m      10/13/2024    2:05 PM 07/19/2024    1:10 PM 04/13/2024    1:31 PM  BP/Weight  Systolic BP 132 125 121  Diastolic BP 88 82 79  Wt. (Lbs) 221 217 223.2  BMI 32.64 kg/m2 32.05 kg/m2 32.96 kg/m2    Physical Exam Vitals and nursing note reviewed.   Constitutional:      General: He is not in acute distress.    Appearance: Normal appearance.  HENT:     Head: Normocephalic and atraumatic.  Cardiovascular:     Rate and Rhythm: Normal rate and regular rhythm.  Pulmonary:     Effort: Pulmonary effort is normal.     Breath sounds: Normal breath sounds. No wheezing or rales.  Neurological:     Mental Status: He is alert.  Psychiatric:        Mood and Affect: Mood normal.        Behavior: Behavior normal.     Lab Results  Component Value Date   WBC 7.6 07/19/2024   HGB 14.3 07/19/2024   HCT 44.2 07/19/2024   PLT 316 07/19/2024   GLUCOSE 81 07/19/2024   CHOL 210 (H) 07/19/2024   TRIG 59 07/19/2024   HDL 77 07/19/2024   LDLCALC 122 (H) 07/19/2024   ALT 20 07/19/2024   AST 22 07/19/2024   NA 140 07/19/2024   K 4.4 07/19/2024   CL 104 07/19/2024   CREATININE 1.09 07/19/2024   BUN 18 07/19/2024   CO2 21 07/19/2024   TSH 1.353 12/25/2017   INR 1.00 12/25/2017   HGBA1C 5.6 03/20/2021     Assessment & Plan:  Adult ADHD Assessment & Plan: Stable. Meds refilled.  Orders: -     Amphetamine -Dextroamphet ER; Take 1 capsule (30 mg total) by mouth every morning.  Dispense: 30 capsule; Refill: 0 -     Amphetamine -Dextroamphet ER; Take 1 capsule (30 mg total) by mouth every morning.  Dispense: 30 capsule; Refill: 0 -     Amphetamine -Dextroamphet ER; Take 1 capsule (30 mg total) by mouth every morning.  Dispense: 30 capsule; Refill: 0    Follow-up:  6 months  Miciah Covelli Bluford DO [facility]

## 2024-10-13 NOTE — Telephone Encounter (Signed)
 Patient was transferred to this RN. Pt states he is at pharmacy and that his Adderall  prescription was not sent in. Writer spoke with PCP clinic to clarify prescription. Clinic states the provider will electronically send over the script once he is out of the patient's room. Pt made aware.

## 2024-11-12 ENCOUNTER — Encounter: Payer: Self-pay | Admitting: Family Medicine

## 2024-11-12 ENCOUNTER — Other Ambulatory Visit: Payer: Self-pay

## 2024-11-13 ENCOUNTER — Other Ambulatory Visit: Payer: Self-pay

## 2025-04-13 ENCOUNTER — Ambulatory Visit: Admitting: Family Medicine
